# Patient Record
Sex: Female | Born: 1985 | Race: White | Hispanic: No | Marital: Single | Smoking: Current every day smoker
Health system: Southern US, Community
[De-identification: ages and names within clinical notes are randomized; demographics above are authoritative.]

## PROBLEM LIST (undated history)

## (undated) ENCOUNTER — Inpatient Hospital Stay (HOSPITAL_COMMUNITY): Payer: Self-pay

## (undated) DIAGNOSIS — I499 Cardiac arrhythmia, unspecified: Secondary | ICD-10-CM

## (undated) DIAGNOSIS — R011 Cardiac murmur, unspecified: Secondary | ICD-10-CM

## (undated) DIAGNOSIS — I493 Ventricular premature depolarization: Secondary | ICD-10-CM

## (undated) DIAGNOSIS — A749 Chlamydial infection, unspecified: Secondary | ICD-10-CM

## (undated) DIAGNOSIS — D696 Thrombocytopenia, unspecified: Secondary | ICD-10-CM

## (undated) HISTORY — DX: Thrombocytopenia, unspecified: D69.6

## (undated) HISTORY — PX: OTHER SURGICAL HISTORY: SHX169

## (undated) HISTORY — PX: DILATION AND CURETTAGE OF UTERUS: SHX78

## (undated) HISTORY — DX: Ventricular premature depolarization: I49.3

---
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
## 2007-02-04 ENCOUNTER — Emergency Department (HOSPITAL_COMMUNITY): Admission: EM | Admit: 2007-02-04 | Discharge: 2007-02-05 | Payer: Self-pay | Admitting: Emergency Medicine

## 2007-02-05 ENCOUNTER — Emergency Department (HOSPITAL_COMMUNITY): Admission: EM | Admit: 2007-02-05 | Discharge: 2007-02-05 | Payer: Self-pay | Admitting: Emergency Medicine

## 2007-03-23 ENCOUNTER — Emergency Department (HOSPITAL_COMMUNITY): Admission: EM | Admit: 2007-03-23 | Discharge: 2007-03-23 | Payer: Self-pay | Admitting: Emergency Medicine

## 2007-06-23 ENCOUNTER — Emergency Department (HOSPITAL_COMMUNITY): Admission: EM | Admit: 2007-06-23 | Discharge: 2007-06-23 | Payer: Self-pay | Admitting: Emergency Medicine

## 2007-07-23 ENCOUNTER — Emergency Department (HOSPITAL_COMMUNITY): Admission: EM | Admit: 2007-07-23 | Discharge: 2007-07-23 | Payer: Self-pay | Admitting: Emergency Medicine

## 2007-07-28 ENCOUNTER — Emergency Department (HOSPITAL_COMMUNITY): Admission: EM | Admit: 2007-07-28 | Discharge: 2007-07-28 | Payer: Self-pay | Admitting: Emergency Medicine

## 2007-09-14 ENCOUNTER — Emergency Department (HOSPITAL_COMMUNITY): Admission: EM | Admit: 2007-09-14 | Discharge: 2007-09-15 | Payer: Self-pay | Admitting: Emergency Medicine

## 2007-09-18 ENCOUNTER — Emergency Department (HOSPITAL_COMMUNITY): Admission: EM | Admit: 2007-09-18 | Discharge: 2007-09-18 | Payer: Self-pay | Admitting: Emergency Medicine

## 2007-09-30 ENCOUNTER — Emergency Department (HOSPITAL_COMMUNITY): Admission: EM | Admit: 2007-09-30 | Discharge: 2007-09-30 | Payer: Self-pay | Admitting: Emergency Medicine

## 2008-01-16 ENCOUNTER — Emergency Department (HOSPITAL_COMMUNITY): Admission: EM | Admit: 2008-01-16 | Discharge: 2008-01-16 | Payer: Self-pay | Admitting: Emergency Medicine

## 2008-01-19 ENCOUNTER — Emergency Department (HOSPITAL_COMMUNITY): Admission: EM | Admit: 2008-01-19 | Discharge: 2008-01-19 | Payer: Self-pay | Admitting: Emergency Medicine

## 2008-03-25 ENCOUNTER — Observation Stay (HOSPITAL_COMMUNITY): Admission: EM | Admit: 2008-03-25 | Discharge: 2008-03-27 | Payer: Self-pay | Admitting: Emergency Medicine

## 2008-03-25 ENCOUNTER — Encounter: Payer: Self-pay | Admitting: Emergency Medicine

## 2008-06-27 ENCOUNTER — Emergency Department (HOSPITAL_COMMUNITY): Admission: EM | Admit: 2008-06-27 | Discharge: 2008-06-27 | Payer: Self-pay | Admitting: Emergency Medicine

## 2008-10-06 ENCOUNTER — Emergency Department (HOSPITAL_COMMUNITY): Admission: EM | Admit: 2008-10-06 | Discharge: 2008-10-07 | Payer: Self-pay | Admitting: Emergency Medicine
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
## 2009-08-15 ENCOUNTER — Emergency Department (HOSPITAL_COMMUNITY): Admission: EM | Admit: 2009-08-15 | Discharge: 2009-08-16 | Payer: Self-pay | Admitting: Emergency Medicine

## 2009-08-18 ENCOUNTER — Emergency Department (HOSPITAL_COMMUNITY): Admission: EM | Admit: 2009-08-18 | Discharge: 2009-08-18 | Payer: Self-pay | Admitting: Emergency Medicine

## 2010-08-01 LAB — GC/CHLAMYDIA PROBE AMP, GENITAL
Chlamydia, DNA Probe: NEGATIVE
GC Probe Amp, Genital: NEGATIVE

## 2010-08-01 LAB — BASIC METABOLIC PANEL
BUN: 10 mg/dL (ref 6–23)
CO2: 23 mEq/L (ref 19–32)
Calcium: 9.2 mg/dL (ref 8.4–10.5)
Creatinine, Ser: 0.82 mg/dL (ref 0.4–1.2)
GFR calc Af Amer: >60 mL/min (ref >60)
GFR calc non Af Amer: >60 mL/min (ref >60)
Potassium: 4.3 mEq/L (ref 3.5–5.1)

## 2010-08-01 LAB — CBC
HCT: 39.4 % (ref 36.0–46.0)
Hemoglobin: 14 g/dL (ref 12.0–15.0)
MCHC: 35.4 g/dL (ref 30.0–36.0)
MCV: 91.4 fL (ref 78.0–100.0)
Platelets: 121 10*3/uL — ABNORMAL LOW (ref 150–400)
RBC: 4.32 MIL/uL (ref 3.87–5.11)
RDW: 12.9 % (ref 11.5–15.5)
WBC: 7.6 10*3/uL (ref 4.0–10.5)

## 2010-08-01 LAB — DIFFERENTIAL
Basophils Absolute: 0 10*3/uL (ref 0.0–0.1)
Eosinophils Relative: 3 % (ref 0–5)
Lymphocytes Relative: 20 % (ref 12–46)
Lymphs Abs: 1.5 10*3/uL (ref 0.7–4.0)
Monocytes Absolute: 0.4 10*3/uL (ref 0.1–1.0)

## 2010-08-01 LAB — WET PREP, GENITAL: Clue Cells Wet Prep HPF POC: NONE SEEN

## 2010-08-01 LAB — HCG, QUANTITATIVE, PREGNANCY
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
## 2010-09-11 ENCOUNTER — Emergency Department (HOSPITAL_COMMUNITY)
Admission: EM | Admit: 2010-09-11 | Discharge: 2010-09-11 | Disposition: A | Discharge status: Home / Self Care | Payer: Self-pay | Attending: Emergency Medicine | Admitting: Emergency Medicine

## 2010-09-11 ENCOUNTER — Emergency Department (HOSPITAL_COMMUNITY): Payer: Self-pay

## 2010-09-11 DIAGNOSIS — R071 Chest pain on breathing: Secondary | ICD-10-CM | POA: Insufficient documentation

## 2010-09-11 DIAGNOSIS — I498 Other specified cardiac arrhythmias: Secondary | ICD-10-CM | POA: Insufficient documentation

## 2010-09-11 DIAGNOSIS — O99891 Other specified diseases and conditions complicating pregnancy: Secondary | ICD-10-CM | POA: Insufficient documentation

## 2010-09-11 DIAGNOSIS — S20219A Contusion of unspecified front wall of thorax, initial encounter: Secondary | ICD-10-CM | POA: Insufficient documentation

## 2010-12-13 ENCOUNTER — Inpatient Hospital Stay (HOSPITAL_COMMUNITY)
Admission: AD | Admit: 2010-12-13 | Discharge: 2010-12-14 | Disposition: A | Discharge status: Home / Self Care | Payer: Self-pay | Source: Ambulatory Visit | Attending: Obstetrics & Gynecology | Admitting: Obstetrics & Gynecology

## 2010-12-13 ENCOUNTER — Encounter (HOSPITAL_COMMUNITY): Payer: Self-pay

## 2010-12-13 DIAGNOSIS — Z1389 Encounter for screening for other disorder: Secondary | ICD-10-CM

## 2010-12-13 DIAGNOSIS — O99891 Other specified diseases and conditions complicating pregnancy: Secondary | ICD-10-CM | POA: Insufficient documentation

## 2010-12-13 DIAGNOSIS — Z3400 Encounter for supervision of normal first pregnancy, unspecified trimester: Secondary | ICD-10-CM

## 2010-12-13 DIAGNOSIS — R109 Unspecified abdominal pain: Secondary | ICD-10-CM | POA: Insufficient documentation

## 2010-12-13 DIAGNOSIS — Z3490 Encounter for supervision of normal pregnancy, unspecified, unspecified trimester: Secondary | ICD-10-CM

## 2010-12-13 HISTORY — DX: Cardiac murmur, unspecified: R01.1

## 2010-12-13 HISTORY — DX: Chlamydial infection, unspecified: A74.9

## 2010-12-13 LAB — URINALYSIS, ROUTINE W REFLEX MICROSCOPIC
Bilirubin Urine: NEGATIVE
Glucose, UA: NEGATIVE mg/dL
Ketones, ur: NEGATIVE mg/dL
Leukocytes, UA: NEGATIVE
Nitrite: NEGATIVE
Specific Gravity, Urine: 1.02 (ref 1.005–1.030)
pH: 6.5 (ref 5.0–8.0)

## 2010-12-13 LAB — CBC
Hemoglobin: 15.1 g/dL — ABNORMAL HIGH (ref 12.0–15.0)
MCH: 32 pg (ref 26.0–34.0)
Platelets: 149 10*3/uL — ABNORMAL LOW (ref 150–400)
RBC: 4.72 MIL/uL (ref 3.87–5.11)
WBC: 13.2 10*3/uL — ABNORMAL HIGH (ref 4.0–10.5)

## 2010-12-13 LAB — URINE MICROSCOPIC-ADD ON

## 2010-12-13 NOTE — Progress Notes (Signed)
Patient is here with c/o vaginal bleeding that started at 2030pm and lower abdominal cramping. She does not have a pad on,. lmp July 18th

## 2010-12-13 NOTE — ED Provider Notes (Signed)
History     Chief Complaint  Patient presents with  . Abdominal Cramping  . Vaginal Bleeding   HPI LMP 7/18, + UPT at home on 8/11 and 8/12 Cramping tonight which is ongoing, suprapubic, noticed spotting afterwards, has since stopped No recent intercourse  OB History    Grav Para Term Preterm Abortions TAB SAB Ect Mult Living   4    3 3           Past Medical History  Diagnosis Date  . Heart murmur     born with condition  . Chlamydia     Past Surgical History  Procedure Date  . Boil     facial boil i&d  . Dilation and curettage of uterus     abortion x3    Family History  Problem Relation Age of Onset  . Emphysema Mother   . Heart attack Father   . Cancer Maternal Grandmother   . Heart attack Paternal Grandmother     History  Substance Use Topics  . Smoking status: Current Everyday Smoker -- 0.2 packs/day    Types: Cigarettes  . Smokeless tobacco: Not on file  . Alcohol Use: No    Allergies:  Allergies  Allergen Reactions  . Penicillins     Never had it - "I just know from birth that I'm not supposed to take it"    No prescriptions prior to admission    Review of Systems  Constitutional: Negative.   Respiratory: Negative.   Cardiovascular: Negative.   Gastrointestinal: Negative for nausea, vomiting, abdominal pain, diarrhea and constipation.  Genitourinary: Negative for dysuria, urgency, frequency, hematuria and flank pain.       Positive for spotting and cramping   Musculoskeletal: Negative.   Neurological: Negative.   Psychiatric/Behavioral: Negative.     Physical Exam   Blood pressure 128/66, pulse 72, temperature 99.4 F (37.4 C), temperature source Oral, resp. rate 20, height 5\' 6"  (1.676 m), weight 81.818 kg (180 lb 6 oz), last menstrual period 11/06/2010.  Physical Exam  Nursing note and vitals reviewed. Constitutional: She is oriented to person, place, and time. She appears well-developed and well-nourished. No distress.  HENT:    Head: Normocephalic and atraumatic.  Cardiovascular: Normal rate.   Respiratory: Effort normal.  GI: Soft. Bowel sounds are normal. She exhibits no mass. There is no tenderness. There is no rebound and no guarding.  Genitourinary: There is no rash or lesion on the right labia. There is no rash or lesion on the left labia. Uterus is not enlarged and not tender. Cervix exhibits no motion tenderness, no discharge and no friability. Right adnexum displays no mass, no tenderness and no fullness. Left adnexum displays no mass, no tenderness and no fullness. No tenderness or bleeding around the vagina. Vaginal discharge (light pinkish creamy discharge) found.  Musculoskeletal: Normal range of motion.  Neurological: She is alert and oriented to person, place, and time.  Skin: Skin is warm and dry.  Psychiatric: She has a normal mood and affect.    MAU Course  Procedures  Results for orders placed during the hospital encounter of 12/13/10 (from the past 24 hour(s))  URINALYSIS, ROUTINE W REFLEX MICROSCOPIC     Status: Abnormal   Collection Time   12/13/10 10:30 PM      Component Value Range   Color, Urine YELLOW  YELLOW    Appearance CLEAR  CLEAR    Specific Gravity, Urine 1.020  1.005 - 1.030    pH 6.5  5.0 - 8.0    Glucose, UA NEGATIVE  NEGATIVE (mg/dL)   Hgb urine dipstick TRACE (*) NEGATIVE    Bilirubin Urine NEGATIVE  NEGATIVE    Ketones, ur NEGATIVE  NEGATIVE (mg/dL)   Protein, ur NEGATIVE  NEGATIVE (mg/dL)   Urobilinogen, UA 0.2  0.0 - 1.0 (mg/dL)   Nitrite NEGATIVE  NEGATIVE    Leukocytes, UA NEGATIVE  NEGATIVE   URINE MICROSCOPIC-ADD ON     Status: Normal   Collection Time   12/13/10 10:30 PM      Component Value Range   Squamous Epithelial / LPF RARE  RARE    WBC, UA    <3 (WBC/hpf)   Value: NO FORMED ELEMENTS SEEN ON URINE MICROSCOPIC EXAMINATION   RBC / HPF 3-6  <3 (RBC/hpf)   Bacteria, UA RARE  RARE   POCT PREGNANCY, URINE     Status: Normal   Collection Time   12/13/10  10:38 PM      Component Value Range   Preg Test, Ur POSITIVE    CBC     Status: Abnormal   Collection Time   12/13/10 11:15 PM      Component Value Range   WBC 13.2 (*) 4.0 - 10.5 (K/uL)   RBC 4.72  3.87 - 5.11 (MIL/uL)   Hemoglobin 15.1 (*) 12.0 - 15.0 (g/dL)   HCT 78.2  95.6 - 21.3 (%)   MCV 91.5  78.0 - 100.0 (fL)   MCH 32.0  26.0 - 34.0 (pg)   MCHC 35.0  30.0 - 36.0 (g/dL)   RDW 08.6  57.8 - 46.9 (%)   Platelets 149 (*) 150 - 400 (K/uL)  ABO/RH     Status: Normal   Collection Time   12/13/10 11:15 PM      Component Value Range   ABO/RH(D) O NEG    HCG, QUANTITATIVE, PREGNANCY     Status: Abnormal   Collection Time   12/13/10 11:16 PM      Component Value Range   hCG, Beta Chain, Quant, S 10477 (*) <5 (mIU/mL)  WET PREP, GENITAL     Status: Abnormal   Collection Time   12/14/10 12:06 AM      Component Value Range   Yeast, Wet Prep NONE SEEN  NONE SEEN    Trich, Wet Prep NONE SEEN  NONE SEEN    Clue Cells, Wet Prep NONE SEEN  NONE SEEN    WBC, Wet Prep HPF POC FEW (*) NONE SEEN    US Ob Comp Less 14 Wks  12/14/2010  *RADIOLOGY REPORT*  Clinical Data: early pregnant, bleeding and pain,spotting; ;  OBSTETRIC <14 WK [place] AND TRANSVAGINAL OB US  Technique: Both transabdominal and transvaginal ultrasound examinations were performed for complete evaluation of the gestation as well as the maternal uterus, adnexal regions, and pelvic cul-de-sac.  Comparison: None.  Findings: Single intrauterine pregnancy.  Based on mean sac diameter, estimated gestational age is 5 weeks 5 days.  Yolk sac is present.  No fetal pole currently.  No subchorionic hemorrhage.  Left corpus luteal cyst.  Right ovary unremarkable.  No free fluid.  IMPRESSION: 5-week-5-day intrauterine pregnancy.  No embryo currently visualized.  Original Report Authenticated By: Cyndie Chime, M.D.   US Ob Transvaginal  12/14/2010  *RADIOLOGY REPORT*  Clinical Data: early pregnant, bleeding and pain,spotting; ;  OBSTETRIC <14 WK  [place] AND TRANSVAGINAL OB US  Technique: Both transabdominal and transvaginal ultrasound examinations were performed for complete evaluation of the gestation  as well as the maternal uterus, adnexal regions, and pelvic cul-de-sac.  Comparison: None.  Findings: Single intrauterine pregnancy.  Based on mean sac diameter, estimated gestational age is 5 weeks 5 days.  Yolk sac is present.  No fetal pole currently.  No subchorionic hemorrhage.  Left corpus luteal cyst.  Right ovary unremarkable.  No free fluid.  IMPRESSION: 5-week-5-day intrauterine pregnancy.  No embryo currently visualized.  Original Report Authenticated By: Cyndie Chime, M.D.    Assessment and Plan  25 y.o. G4P0030 with [redacted]w[redacted]d IUP Moving to [place], plans prenatal care there Rev'd precautions, return to ED with increased bleeding or pain  [name] 12/13/2010, 11:52 PM

## 2010-12-13 NOTE — Progress Notes (Signed)
Pt states, " I had a positive home pregnancy test on August 12th. Tonight I started cramping at 8:30pm, and I went to the bathroom and saw blood on my panties . I wiped and there was blood on the paper. I went again and wiped and didn't see anymore blood."

## 2010-12-14 ENCOUNTER — Inpatient Hospital Stay (HOSPITAL_COMMUNITY): Payer: Self-pay

## 2010-12-14 DIAGNOSIS — Z3400 Encounter for supervision of normal first pregnancy, unspecified trimester: Secondary | ICD-10-CM

## 2010-12-14 LAB — ABO/RH: ABO/RH(D): O NEG

## 2010-12-14 LAB — WET PREP, GENITAL: Yeast Wet Prep HPF POC: NONE SEEN

## 2010-12-14 LAB — GC/CHLAMYDIA PROBE AMP, GENITAL
Chlamydia, DNA Probe: NEGATIVE
GC Probe Amp, Genital: NEGATIVE

## 2011-01-14 LAB — CBC
HCT: 40.8
MCHC: 34
MCV: 90.3
Platelets: 137 — ABNORMAL LOW
RDW: 13
WBC: 7.8

## 2011-01-14 LAB — RAPID URINE DRUG SCREEN, HOSP PERFORMED
Barbiturates: NOT DETECTED
Benzodiazepines: NOT DETECTED
Cocaine: NOT DETECTED
Opiates: NOT DETECTED

## 2011-01-14 LAB — URINALYSIS, ROUTINE W REFLEX MICROSCOPIC
Bilirubin Urine: NEGATIVE
Ketones, ur: NEGATIVE
Nitrite: NEGATIVE
Protein, ur: >300 — AB
pH: 7

## 2011-01-14 LAB — BASIC METABOLIC PANEL
BUN: 9
CO2: 26
Chloride: 106
Creatinine, Ser: 0.76
Glucose, Bld: 91
Potassium: 4

## 2011-01-14 LAB — DIFFERENTIAL
Basophils Relative: 1
Eosinophils Absolute: 0.3
Eosinophils Relative: 4
Lymphs Abs: 1.5
Neutrophils Relative %: 72

## 2011-01-14 LAB — PREGNANCY, URINE: Preg Test, Ur: NEGATIVE

## 2011-01-14 LAB — URINE MICROSCOPIC-ADD ON

## 2011-01-15 LAB — CBC
MCHC: 34
Platelets: 168
Platelets: 142 — ABNORMAL LOW
RDW: 13.1
RDW: 13.1
WBC: 12.9 — ABNORMAL HIGH

## 2011-01-15 LAB — DIFFERENTIAL
Basophils Absolute: 0
Basophils Absolute: 0.1
Basophils Relative: 0
Eosinophils Relative: 1
Lymphocytes Relative: 26
Lymphocytes Relative: 13
Lymphs Abs: 1.7
Neutro Abs: 5.9
Neutro Abs: 10.4 — ABNORMAL HIGH
Neutrophils Relative %: 81 — ABNORMAL HIGH

## 2011-01-15 LAB — POCT I-STAT, CHEM 8
BUN: 13
Chloride: 104
HCT: 41
Sodium: 139
TCO2: 28

## 2011-01-15 LAB — URINALYSIS, ROUTINE W REFLEX MICROSCOPIC
Bilirubin Urine: NEGATIVE
Bilirubin Urine: NEGATIVE
Glucose, UA: NEGATIVE
Ketones, ur: NEGATIVE
Ketones, ur: NEGATIVE
Nitrite: NEGATIVE
Protein, ur: NEGATIVE
Specific Gravity, Urine: 1.018
Urobilinogen, UA: 0.2
Urobilinogen, UA: 1
pH: 5.5
pH: 7

## 2011-01-15 LAB — GC/CHLAMYDIA PROBE AMP, GENITAL
Chlamydia, DNA Probe: POSITIVE — AB
GC Probe Amp, Genital: NEGATIVE

## 2011-01-15 LAB — POCT PREGNANCY, URINE
Operator id: 196461
Preg Test, Ur: NEGATIVE

## 2011-01-15 LAB — WET PREP, GENITAL
Clue Cells Wet Prep HPF POC: NONE SEEN
Trich, Wet Prep: NONE SEEN
Trich, Wet Prep: NONE SEEN
Yeast Wet Prep HPF POC: NONE SEEN

## 2011-01-15 LAB — URINE MICROSCOPIC-ADD ON

## 2011-01-15 LAB — COMPREHENSIVE METABOLIC PANEL
ALT: 11
AST: 17
Albumin: 4
CO2: 28
Chloride: 103
Creatinine, Ser: 0.79
GFR calc Af Amer: >60
Sodium: 139
Total Bilirubin: 0.7

## 2011-01-15 LAB — RPR: RPR Ser Ql: NONREACTIVE

## 2011-01-15 LAB — URINE CULTURE: Culture: NO GROWTH

## 2011-01-24 LAB — CBC
HCT: 38.1 % (ref 36.0–46.0)
HCT: 41.2 % (ref 36.0–46.0)
Hemoglobin: 12.8 g/dL (ref 12.0–15.0)
Hemoglobin: 13.9 g/dL (ref 12.0–15.0)
MCHC: 33.7 g/dL (ref 30.0–36.0)
MCV: 91.9 fL (ref 78.0–100.0)
Platelets: 128 10*3/uL — ABNORMAL LOW (ref 150–400)
Platelets: 140 10*3/uL — ABNORMAL LOW (ref 150–400)
RBC: 4.18 MIL/uL (ref 3.87–5.11)
RDW: 12.9 % (ref 11.5–15.5)
RDW: 13 % (ref 11.5–15.5)
WBC: 10.4 10*3/uL (ref 4.0–10.5)
WBC: 9.1 10*3/uL (ref 4.0–10.5)

## 2011-01-24 LAB — POCT I-STAT, CHEM 8
Calcium, Ion: 0.97 mmol/L — ABNORMAL LOW (ref 1.12–1.32)
Chloride: 110 mEq/L (ref 96–112)
HCT: 43 % (ref 36.0–46.0)
Hemoglobin: 14.6 g/dL (ref 12.0–15.0)
TCO2: 18 mmol/L (ref 0–100)

## 2011-01-24 LAB — BASIC METABOLIC PANEL
BUN: 11 mg/dL (ref 6–23)
Calcium: 9.1 mg/dL (ref 8.4–10.5)
GFR calc non Af Amer: >60 mL/min (ref >60)
Glucose, Bld: 81 mg/dL (ref 70–99)

## 2011-01-24 LAB — URINALYSIS, ROUTINE W REFLEX MICROSCOPIC
Bilirubin Urine: NEGATIVE
Glucose, UA: NEGATIVE mg/dL
Hgb urine dipstick: NEGATIVE
Ketones, ur: NEGATIVE mg/dL
Nitrite: NEGATIVE
Nitrite: NEGATIVE
Specific Gravity, Urine: 1.028 (ref 1.005–1.030)
Urobilinogen, UA: 1 mg/dL (ref 0.0–1.0)
pH: 6 (ref 5.0–8.0)
pH: 6 (ref 5.0–8.0)

## 2011-01-24 LAB — DIFFERENTIAL
Basophils Absolute: 0 10*3/uL (ref 0.0–0.1)
Basophils Absolute: 0 10*3/uL (ref 0.0–0.1)
Eosinophils Relative: 3 % (ref 0–5)
Lymphocytes Relative: 15 % (ref 12–46)
Lymphocytes Relative: 22 % (ref 12–46)
Monocytes Absolute: 0.4 10*3/uL (ref 0.1–1.0)
Neutro Abs: 7 10*3/uL (ref 1.7–7.7)
Neutrophils Relative %: 67 % (ref 43–77)

## 2011-01-24 LAB — URINE MICROSCOPIC-ADD ON

## 2011-01-27 LAB — RAPID STREP SCREEN (MED CTR MEBANE ONLY): Streptococcus, Group A Screen (Direct): NEGATIVE

## 2011-01-29 LAB — CBC
Hemoglobin: 11.1 — ABNORMAL LOW
MCHC: 34.4
Platelets: 123 — ABNORMAL LOW
RBC: 3.57 — ABNORMAL LOW
RDW: 12.8
RDW: 13

## 2011-01-29 LAB — COMPREHENSIVE METABOLIC PANEL
AST: 15
Albumin: 2.5 — ABNORMAL LOW
Chloride: 107
Creatinine, Ser: 0.66
GFR calc Af Amer: >60
Total Bilirubin: 0.6

## 2011-01-29 LAB — DIFFERENTIAL
Basophils Absolute: 0
Basophils Relative: 0
Eosinophils Relative: 0
Lymphocytes Relative: 13
Lymphocytes Relative: 19
Lymphs Abs: 1.3
Monocytes Absolute: 0.5
Neutro Abs: 8.4 — ABNORMAL HIGH
Neutrophils Relative %: 80 — ABNORMAL HIGH

## 2011-01-29 LAB — RAPID STREP SCREEN (MED CTR MEBANE ONLY): Streptococcus, Group A Screen (Direct): NEGATIVE

## 2011-02-13 LAB — ABO/RH: RH Type: NEGATIVE

## 2011-02-13 LAB — RUBELLA ANTIBODY, IGM: Rubella: IMMUNE

## 2011-04-22 NOTE — L&D Delivery Note (Signed)
Delivery Note At 4:27 PM a viable and healthy female was delivered via Vaginal, Spontaneous Delivery (Presentation: Middle Occiput Anterior).  Pt had no chest pain or complications while pushing.   APGAR: 7, 9; weight .   Placenta status: Intact, Spontaneous.  Cord: 3 vessels with the following complications: None.  Cord pH: n/a  Anesthesia: Epidural  Episiotomy: None Lacerations: None Suture Repair: n/a Est. Blood Loss (mL): 500  Mom to postpartum.  Baby to nursery-stable.  [facility] 08/23/2011, 5:20 PM

## 2011-05-22 LAB — GLUCOSE TOLERANCE, 1 HOUR: Glucose, 1 hour: 87

## 2011-05-22 LAB — CBC
HCT: 36 % (ref 36–46)
Platelets: 127 10*3/uL — AB (ref 150–399)

## 2011-06-29 LAB — OB RESULTS CONSOLE HEPATITIS B SURFACE ANTIGEN: Hepatitis B Surface Ag: NEGATIVE

## 2011-07-01 ENCOUNTER — Encounter: Payer: Self-pay | Admitting: Obstetrics & Gynecology

## 2011-07-08 DIAGNOSIS — O360990 Maternal care for other rhesus isoimmunization, unspecified trimester, not applicable or unspecified: Secondary | ICD-10-CM | POA: Insufficient documentation

## 2011-07-08 DIAGNOSIS — D696 Thrombocytopenia, unspecified: Secondary | ICD-10-CM

## 2011-07-10 ENCOUNTER — Encounter: Payer: Self-pay | Admitting: Family Medicine

## 2011-07-10 ENCOUNTER — Ambulatory Visit (INDEPENDENT_AMBULATORY_CARE_PROVIDER_SITE_OTHER): Payer: Self-pay | Admitting: Family Medicine

## 2011-07-10 VITALS — BP 105/74 | Temp 96.7°F | Wt 225.9 lb

## 2011-07-10 DIAGNOSIS — O99333 Smoking (tobacco) complicating pregnancy, third trimester: Secondary | ICD-10-CM

## 2011-07-10 DIAGNOSIS — Z3400 Encounter for supervision of normal first pregnancy, unspecified trimester: Secondary | ICD-10-CM

## 2011-07-10 DIAGNOSIS — O2340 Unspecified infection of urinary tract in pregnancy, unspecified trimester: Secondary | ICD-10-CM

## 2011-07-10 DIAGNOSIS — D696 Thrombocytopenia, unspecified: Secondary | ICD-10-CM

## 2011-07-10 DIAGNOSIS — O99330 Smoking (tobacco) complicating pregnancy, unspecified trimester: Secondary | ICD-10-CM

## 2011-07-10 DIAGNOSIS — O2390 Unspecified genitourinary tract infection in pregnancy, unspecified trimester: Secondary | ICD-10-CM

## 2011-07-10 DIAGNOSIS — N390 Urinary tract infection, site not specified: Secondary | ICD-10-CM

## 2011-07-10 LAB — POCT URINALYSIS DIP (DEVICE)
Ketones, ur: 15 mg/dL — AB
Leukocytes, UA: NEGATIVE

## 2011-07-10 NOTE — Progress Notes (Signed)
Edema-feet. Pelvic pressure. Pulse 93. Pt to see nutrition and Child psychotherapist.

## 2011-07-10 NOTE — Patient Instructions (Signed)
Pregnancy and Smoking Smoking during pregnancy is very unhealthy for the mother and the developing fetus. The addictive drug in cigarettes (nicotine), carbon monoxide, and many other poisons are inhaled from a cigarette and are carried through your bloodstream to your fetus. Cigarette smoke contains more than 2,500 chemicals. It is not known which of these chemicals are harmful to the developing fetus. However, both nicotine and carbon monoxide play a role in causing health problems in pregnancy. Effects on the fetus of smoking during pregnancy:  Decrease in blood flow and oxygen to the uterus, placenta, and your fetus.   Increased heart rate of the fetus.   Slowing of your fetus's growth in the uterus (intrauterine growth retardation).   Placental problems. Placenta may partially cover or completely cover the opening to the cervix (placenta previa), or the placenta may partially or completely separate from the uterus (placental abruption).   Increase risk of pregnancy outside of the uterus (tubal pregnancy).   Premature rupture membranes, causing the sac that holds the fetus to break too early, resulting in premature birth and increased health risks to the newborn.   Increased risk of birth defects, including heart defects.   Increased risk of miscarriage.  Newborns born to women who smoke during pregnancy:  Are more likely to be born too early (prematurely).   Are more likely to be at a low birth weight.   Are at risk for serious health problems, chronic or lifelong disabilities (cerebral palsy, mental retardation, learning problems), and possibly even death   Are at risk of Sudden Infant Death Syndrome (SIDS).   Have higher rates of miscarriage and stillbirth.   Have more lung and breathing (respiratory) problems.  Long-term effects on a child's behavior: Some of the following trends are seen with children of smoking mothers:  Increased risk for drug abuse, behavior, and  conduct disorders.   Increased risk for smoking in adolescent girls.   Increased risk for negative behavior in 2-year-olds.   Increase risk for asthma, colic, and childhood obesity, which can lead to diabetes.   Increased risk for finger and toe disorders.  Resources to stop smoking during pregnancy:  Counseling.   Psychological treatment.   Acupuncture.   Family intervention.   Hypnosis.   Medicines that are safe to take during pregnancy. Nicotine supplements have not been studied enough. They should only be considered when all other methods fail.   Telephone QUIT lines.  Smoking and Breastfeeding:  Nicotine gets passed to the infant through a mother's breastmilk. This can cause nausea, colic, cramping, and diarrhea in the infant.   Smoking may reduce milk supply and interfere with the let-down response.   Even formula-fed infants of mothers who smoke have nicotine and cotinine (nicotine by-product) in their urine.  Other resources to help stop smoking:  American Cancer Society: www.cancer.org   American Heart Association: www.americanheart.org   National Cancer Institute: www.cancer.gov   Smoke Free Families: www.smokefreefamilies.[address] [place] Line): 732-700-7872 START  Document Released: 08/19/2004 Document Revised: 03/27/2011 Document Reviewed: 01/17/2009 [facility] Patient Information 2012 [place], [place]. Contraception Choices Contraception (birth control) is the use of any methods or devices to prevent pregnancy. Below are some methods to help avoid pregnancy. HORMONAL METHODS   Contraceptive implant. This is a thin, plastic tube containing progesterone hormone. It does not contain estrogen hormone. Your caregiver inserts the tube in the inner part of the upper arm. The tube can remain in place for up to 3 years. After 3 years,  the implant must be removed. The implant prevents the ovaries from releasing an egg (ovulation), thickens the cervical mucus which  prevents sperm from entering the uterus, and thins the lining of the inside of the uterus.   Progesterone-only injections. These injections are given every 3 months by your caregiver to prevent pregnancy. This synthetic progesterone hormone stops the ovaries from releasing eggs. It also thickens cervical mucus and changes the uterine lining. This makes it harder for sperm to survive in the uterus.   Birth control pills. These pills contain estrogen and progesterone hormone. They work by stopping the egg from forming in the ovary (ovulation). Birth control pills are prescribed by a caregiver.Birth control pills can also be used to treat heavy periods.   Minipill. This type of birth control pill contains only the progesterone hormone. They are taken every day of each month and must be prescribed by your caregiver.   Birth control patch. The patch contains hormones similar to those in birth control pills. It must be changed once a week and is prescribed by a caregiver.   Vaginal ring. The ring contains hormones similar to those in birth control pills. It is left in the vagina for 3 weeks, removed for 1 week, and then a new one is put back in place. The patient must be comfortable inserting and removing the ring from the vagina.A caregiver's prescription is necessary.   Emergency contraception. Emergency contraceptives prevent pregnancy after unprotected sexual intercourse. This pill can be taken right after sex or up to 5 days after unprotected sex. It is most effective the sooner you take the pills after having sexual intercourse. Emergency contraceptive pills are available without a prescription. Check with your pharmacist. Do not use emergency contraception as your only form of birth control.  BARRIER METHODS   Female condom. This is a thin sheath (latex or rubber) that is worn over the penis during sexual intercourse. It can be used with spermicide to increase effectiveness.   Female condom. This  is a soft, loose-fitting sheath that is put into the vagina before sexual intercourse.   Diaphragm. This is a soft, latex, dome-shaped barrier that must be fitted by a caregiver. It is inserted into the vagina, along with a spermicidal jelly. It is inserted before intercourse. The diaphragm should be left in the vagina for 6 to 8 hours after intercourse.   Cervical cap. This is a round, soft, latex or plastic cup that fits over the cervix and must be fitted by a caregiver. The cap can be left in place for up to 48 hours after intercourse.   Sponge. This is a soft, circular piece of polyurethane foam. The sponge has spermicide in it. It is inserted into the vagina after wetting it and before sexual intercourse.   Spermicides. These are chemicals that kill or block sperm from entering the cervix and uterus. They come in the form of creams, jellies, suppositories, foam, or tablets. They do not require a prescription. They are inserted into the vagina with an applicator before having sexual intercourse. The process must be repeated every time you have sexual intercourse.  INTRAUTERINE CONTRACEPTION  Intrauterine device (IUD). This is a T-shaped device that is put in a woman's uterus during a menstrual period to prevent pregnancy. There are 2 types:   Copper IUD. This type of IUD is wrapped in copper wire and is placed inside the uterus. Copper makes the uterus and fallopian tubes produce a fluid that kills sperm. It can  stay in place for 10 years.   Hormone IUD. This type of IUD contains the hormone progestin (synthetic progesterone). The hormone thickens the cervical mucus and prevents sperm from entering the uterus, and it also thins the uterine lining to prevent implantation of a fertilized egg. The hormone can weaken or kill the sperm that get into the uterus. It can stay in place for 5 years.  PERMANENT METHODS OF CONTRACEPTION  Female tubal ligation. This is when the woman's fallopian tubes are  surgically sealed, tied, or blocked to prevent the egg from traveling to the uterus.   Female sterilization. This is when the female has the tubes that carry sperm tied off (vasectomy).This blocks sperm from entering the vagina during sexual intercourse. After the procedure, the man can still ejaculate fluid (semen).  NATURAL PLANNING METHODS  Natural family planning. This is not having sexual intercourse or using a barrier method (condom, diaphragm, cervical cap) on days the woman could become pregnant.   Calendar method. This is keeping track of the length of each menstrual cycle and identifying when you are fertile.   Ovulation method. This is avoiding sexual intercourse during ovulation.   Symptothermal method. This is avoiding sexual intercourse during ovulation, using a thermometer and ovulation symptoms.   Post-ovulation method. This is timing sexual intercourse after you have ovulated.  Regardless of which type or method of contraception you choose, it is important that you use condoms to protect against the transmission of sexually transmitted diseases (STDs). Talk with your caregiver about which form of contraception is most appropriate for you. Document Released: 04/07/2005 Document Revised: 03/27/2011 Document Reviewed: 08/14/2010 [facility] Patient Information 2012 [place], [place]. Breastfeeding BENEFITS OF BREASTFEEDING For the baby  The first milk (colostrum) helps the baby's digestive system function better.   There are antibodies from the mother in the milk that help the baby fight off infections.   The baby has a lower incidence of asthma, allergies, and SIDS (sudden infant death syndrome).   The nutrients in breast milk are better than formulas for the baby and helps the baby's brain grow better.   Babies who breastfeed have less gas, colic, and constipation.  For the mother  Breastfeeding helps develop a very special bond between mother and baby.   It is more  convenient, always available at the correct temperature and cheaper than formula feeding.   It burns calories in the mother and helps with losing weight that was gained during pregnancy.   It makes the uterus contract back down to normal size faster and slows bleeding following delivery.   Breastfeeding mothers have a lower risk of developing breast cancer.  NURSE FREQUENTLY  A healthy, full-term baby may breastfeed as often as every hour or space his or her feedings to every 3 hours.   How often to nurse will vary from baby to baby. Watch your baby for signs of hunger, not the clock.   Nurse as often as the baby requests, or when you feel the need to reduce the fullness of your breasts.   Awaken the baby if it has been 3 to 4 hours since the last feeding.   Frequent feeding will help the mother make more milk and will prevent problems like sore nipples and engorgement of the breasts.  BABY'S POSITION AT THE BREAST  Whether lying down or sitting, be sure that the baby's tummy is facing your tummy.   Support the breast with 4 fingers underneath the breast and the thumb above.  Make sure your fingers are well away from the nipple and baby's mouth.   Stroke the baby's lips and cheek closest to the breast gently with your finger or nipple.   When the baby's mouth is open wide enough, place all of your nipple and as much of the dark area around the nipple as possible into your baby's mouth.   Pull the baby in close so the tip of the nose and the baby's cheeks touch the breast during the feeding.  FEEDINGS  The length of each feeding varies from baby to baby and from feeding to feeding.   The baby must suck about 2 to 3 minutes for your milk to get to him or her. This is called a "let down." For this reason, allow the baby to feed on each breast as long as he or she wants. Your baby will end the feeding when he or she has received the right balance of nutrients.   To break the suction,  put your finger into the corner of the baby's mouth and slide it between his or her gums before removing your breast from his or her mouth. This will help prevent sore nipples.  REDUCING BREAST ENGORGEMENT  In the first week after your baby is born, you may experience signs of breast engorgement. When breasts are engorged, they feel heavy, warm, full, and may be tender to the touch. You can reduce engorgement if you:   Nurse frequently, every 2 to 3 hours. Mothers who breastfeed early and often have fewer problems with engorgement.   Place light ice packs on your breasts between feedings. This reduces swelling. Wrap the ice packs in a lightweight towel to protect your skin.   Apply moist hot packs to your breast for 5 to 10 minutes before each feeding. This increases circulation and helps the milk flow.   Gently massage your breast before and during the feeding.   Make sure that the baby empties at least one breast at every feeding before switching sides.   Use a breast pump to empty the breasts if your baby is sleepy or not nursing well. You may also want to pump if you are returning to work or or you feel you are getting engorged.   Avoid bottle feeds, pacifiers or supplemental feedings of water or juice in place of breastfeeding.   Be sure the baby is latched on and positioned properly while breastfeeding.   Prevent fatigue, stress, and anemia.   Wear a supportive bra, avoiding underwire styles.   Eat a balanced diet with enough fluids.  If you follow these suggestions, your engorgement should improve in 24 to 48 hours. If you are still experiencing difficulty, call your lactation consultant or caregiver. IS MY BABY GETTING ENOUGH MILK? Sometimes, mothers worry about whether their babies are getting enough milk. You can be assured that your baby is getting enough milk if:  The baby is actively sucking and you hear swallowing.   The baby nurses at least 8 to 12 times in a 24 hour  time period. Nurse your baby until he or she unlatches or falls asleep at the first breast (at least 10 to 20 minutes), then offer the second side.   The baby is wetting 5 to 6 disposable diapers (6 to 8 cloth diapers) in a 24 hour period by 79 to 54 days of age.   The baby is having at least 2 to 3 stools every 24 hours for the first few months.  Breast milk is all the food your baby needs. It is not necessary for your baby to have water or formula. In fact, to help your breasts make more milk, it is best not to give your baby supplemental feedings during the early weeks.   The stool should be soft and yellow.   The baby should gain 4 to 7 ounces per week after he is 66 days old.  TAKE CARE OF YOURSELF Take care of your breasts by:  Bathing or showering daily.   Avoiding the use of soaps on your nipples.   Start feedings on your left breast at one feeding and on your right breast at the next feeding.   You will notice an increase in your milk supply 2 to 5 days after delivery. You may feel some discomfort from engorgement, which makes your breasts very firm and often tender. Engorgement "peaks" out within 24 to 48 hours. In the meantime, apply warm moist towels to your breasts for 5 to 10 minutes before feeding. Gentle massage and expression of some milk before feeding will soften your breasts, making it easier for your baby to latch on. Wear a well fitting nursing bra and air dry your nipples for 10 to 15 minutes after each feeding.   Only use cotton bra pads.   Only use pure lanolin on your nipples after nursing. You do not need to wash it off before nursing.  Take care of yourself by:   Eating well-balanced meals and nutritious snacks.   Drinking milk, fruit juice, and water to satisfy your thirst (about 8 glasses a day).   Getting plenty of rest.   Increasing calcium in your diet (1200 mg a day).   Avoiding foods that you notice affect the baby in a bad way.  SEEK MEDICAL CARE IF:     You have any questions or difficulty with breastfeeding.   You need help.   You have a hard, red, sore area on your breast, accompanied by a fever of 100.5 F (38.1 C) or more.   Your baby is too sleepy to eat well or is having trouble sleeping.   Your baby is wetting less than 6 diapers per day, by 90 days of age.   Your baby's skin or white part of his or her eyes is more yellow than it was in the hospital.   You feel depressed.  Document Released: 04/07/2005 Document Revised: 03/27/2011 Document Reviewed: 11/20/2008 [facility] Patient Information 2012 [place], [place].

## 2011-07-10 NOTE — Progress Notes (Incomplete)
Nutrition Note: (1st consult/1st appt) Pt is an overweight [redacted]w[redacted]d gestation woman who just moved to area from [place]. Pt has excessive wt gain of 45.9# with first(current) pregnancy.  Pt has hx of heart murmur and anemia. Currently taking PNV daily and antibiotic.  Pt reports excessive intake of 3 large meals and 4 snacks daily. 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  Disc wt gain recs and importance of smaller meals for duration of pregnancy and postpartum.  Pt plans to breastfeed and will get [facility] transferred from [place] asap. Follow up if referred.  Cy Blamer, RD Pt does not drink soy milk or lactaid, dislikes the texture and consistency.

## 2011-07-10 NOTE — Progress Notes (Signed)
Has UTI-on Abx, Has had most labs in [place]--May go to [facility]

## 2011-07-14 ENCOUNTER — Inpatient Hospital Stay (HOSPITAL_COMMUNITY)
Admission: AD | Admit: 2011-07-14 | Discharge: 2011-07-14 | Discharge status: Against Medical Advice | Payer: Medicaid Other | Source: Ambulatory Visit | Attending: Obstetrics & Gynecology | Admitting: Obstetrics & Gynecology

## 2011-07-14 ENCOUNTER — Encounter (HOSPITAL_COMMUNITY): Payer: Self-pay | Admitting: *Deleted

## 2011-07-14 ENCOUNTER — Other Ambulatory Visit: Payer: Self-pay

## 2011-07-14 DIAGNOSIS — R61 Generalized hyperhidrosis: Secondary | ICD-10-CM | POA: Insufficient documentation

## 2011-07-14 DIAGNOSIS — O212 Late vomiting of pregnancy: Secondary | ICD-10-CM | POA: Insufficient documentation

## 2011-07-14 DIAGNOSIS — O99891 Other specified diseases and conditions complicating pregnancy: Secondary | ICD-10-CM | POA: Insufficient documentation

## 2011-07-14 DIAGNOSIS — N949 Unspecified condition associated with female genital organs and menstrual cycle: Secondary | ICD-10-CM | POA: Insufficient documentation

## 2011-07-14 DIAGNOSIS — R0602 Shortness of breath: Secondary | ICD-10-CM | POA: Insufficient documentation

## 2011-07-14 LAB — CBC
Hemoglobin: 13 g/dL (ref 12.0–15.0)
MCH: 30.9 pg (ref 26.0–34.0)
RBC: 4.21 MIL/uL (ref 3.87–5.11)
WBC: 17 10*3/uL — ABNORMAL HIGH (ref 4.0–10.5)

## 2011-07-14 LAB — COMPREHENSIVE METABOLIC PANEL
AST: 15 U/L (ref 0–37)
Albumin: 3 g/dL — ABNORMAL LOW (ref 3.5–5.2)
CO2: 23 mEq/L (ref 19–32)
Calcium: 9.3 mg/dL (ref 8.4–10.5)
Creatinine, Ser: 0.54 mg/dL (ref 0.50–1.10)
GFR calc non Af Amer: >90 mL/min (ref >90)

## 2011-07-14 LAB — CARDIAC PANEL(CRET KIN+CKTOT+MB+TROPI)
Relative Index: INVALID (ref 0.0–2.5)
Total CK: 39 U/L (ref 7–177)
Troponin I: <0.3 ng/mL (ref <0.30)

## 2011-07-14 NOTE — MAU Provider Note (Signed)
History     CSN: [id number]  Arrival date and time: 07/14/11 1843   First Provider Initiated Contact with Patient 07/14/11 1921      No chief complaint on file.  HPI Patient is a 26 yo G4P0030 at [redacted]w[redacted]d by [facility] [place] presenting for "feeling like my cervix is changing" as well as new onset vomiting. Patient has a past medical history of thrombocytopenia, congenital heart murmur (unknown diagnosis) and prenatal care in [place]. She states she has chest pain daily, and what she is experiencing now is not new but patient's pulse ox is not accurately measuring her pulse. She states she feels like her cervix is changing because she has had 3 prior abortions and what she is feeling now feels the same as what she experienced then. She has not had vomiting during her pregnancy but has recently started vomiting. No sick contacts. Patient denies vaginal bleeding, discharge, LOF. States the baby is moving, but less than before.   She was seen in our clinic last week. Records were faxed from [place], but they were not completely uploaded to Epic. Recently treated with Macrobid for symptomatic UTI.  OB History    Grav Para Term Preterm Abortions TAB SAB Ect Mult Living   4    3 3           Past Medical History  Diagnosis Date  . Heart murmur     born with condition  . Chlamydia     Past Surgical History  Procedure Date  . Boil     facial boil i&d  . Dilation and curettage of uterus     abortion x3    Family History  Problem Relation Age of Onset  . Emphysema Mother   . Heart attack Father   . Heart disease Father   . Hypertension Father   . Cancer Maternal Grandmother   . Heart attack Paternal Grandmother   . Thyroid disease Paternal Grandmother   . Stroke Maternal Grandfather   . Hypertension Paternal Grandfather     History  Substance Use Topics  . Smoking status: Current Everyday Smoker -- 0.2 packs/day    Types: Cigarettes  . Smokeless tobacco: Not on file  .  Alcohol Use: No    Allergies:  Allergies  Allergen Reactions  . Penicillins Other (See Comments)    Childhood allergy; reaction unknown    Prescriptions prior to admission  Medication Sig Dispense Refill  . nitrofurantoin, macrocrystal-monohydrate, (MACROBID) 100 MG capsule Take 100 mg by mouth 2 (two) times daily.      . prenatal vitamin w/FE, FA (PRENATAL 1 + 1) 27-1 MG TABS Take 1 tablet by mouth daily.          Review of Systems  Constitutional: Negative for fever and chills.  Respiratory: Negative for shortness of breath.   Cardiovascular: Positive for chest pain and palpitations.  Gastrointestinal: Positive for nausea and vomiting.  Genitourinary: Negative for dysuria.  Musculoskeletal: Positive for back pain.  Skin: Negative for rash.  Neurological: Negative for dizziness and headaches.   Physical Exam   Blood pressure 121/75, pulse 143, temperature 98 F (36.7 C), temperature source Oral, resp. rate 16, height 5\' 6"  (1.676 m), weight 103.59 kg (228 lb 6 oz), last menstrual period 11/06/2010, SpO2 98.00%.  Physical Exam  Constitutional: She is oriented to person, place, and time. She appears well-developed and well-nourished. No distress.  HENT:  Head: Normocephalic and atraumatic.  Neck: Normal range of motion.  Cardiovascular: Normal  pulses.  An irregularly irregular rhythm present.  Murmur heard.      Tachy/brady with palpitations. Murmur noted but difficult for me to characterize  Respiratory: Effort normal and breath sounds normal. She has no wheezes.  GI: Soft. There is no tenderness.       Gravid. Toco in place.  Musculoskeletal: Normal range of motion. She exhibits no tenderness.  Neurological: She is alert and oriented to person, place, and time. No cranial nerve deficit.  Skin: No rash noted. She is diaphoretic.    MAU Course  Procedures No results found for this or any previous visit (from the past 24 hour(s)).  MDM Irregular heart rate. Difficult  to trace on pulse ox. Will get 12 lead EKG Patient will need cervical check  Assessment and Plan  26 yo G4P0030 presenting for vaginal pain and vomiting, diaphoresis, and shortness of breath   Ayala, Pamela 07/14/2011, 7:29 PM   Pt seen and examined. Pelvic pressure is closed, short, posterior, and high.  No evidence of labor.   FHT 125 baseline, 15 x 15 accels, no decels, moderate variability. No obstetrical issue. Pr having SOB and diaphoretic.  IRIR heart beat.  Pt has had echo and shows regurgitation of one valve but she is unsure which one.   History of thrombocytopenia  Pt will be transferred to [facility] for evaluation of her heart and SOB. Dr. Judd Ayala accepted patient.

## 2011-07-14 NOTE — MAU Note (Signed)
Patient signed out AMA refusing to be transferred to [facility] ED for further evaluation and treatment of heart condition. Patient also refuses to stay for lab results. Dr. Penne Lash discussed the benefits of being transferred as well as the risks of going home without further evaluation. Patient verbalizes understanding and AMA consent signed.

## 2011-07-16 ENCOUNTER — Ambulatory Visit (INDEPENDENT_AMBULATORY_CARE_PROVIDER_SITE_OTHER): Payer: Self-pay | Admitting: Advanced Practice Midwife

## 2011-07-16 VITALS — BP 126/78 | Temp 98.2°F | Wt 228.5 lb

## 2011-07-16 DIAGNOSIS — I499 Cardiac arrhythmia, unspecified: Secondary | ICD-10-CM

## 2011-07-16 DIAGNOSIS — I2510 Atherosclerotic heart disease of native coronary artery without angina pectoris: Secondary | ICD-10-CM

## 2011-07-16 DIAGNOSIS — O360990 Maternal care for other rhesus isoimmunization, unspecified trimester, not applicable or unspecified: Secondary | ICD-10-CM

## 2011-07-16 DIAGNOSIS — O99330 Smoking (tobacco) complicating pregnancy, unspecified trimester: Secondary | ICD-10-CM

## 2011-07-16 DIAGNOSIS — O99419 Diseases of the circulatory system complicating pregnancy, unspecified trimester: Secondary | ICD-10-CM

## 2011-07-16 DIAGNOSIS — Z3400 Encounter for supervision of normal first pregnancy, unspecified trimester: Secondary | ICD-10-CM

## 2011-07-16 LAB — POCT URINALYSIS DIP (DEVICE)
Glucose, UA: NEGATIVE mg/dL
Hgb urine dipstick: NEGATIVE
Ketones, ur: NEGATIVE mg/dL
Specific Gravity, Urine: 1.025 (ref 1.005–1.030)
Urobilinogen, UA: 1 mg/dL (ref 0.0–1.0)

## 2011-07-16 NOTE — Progress Notes (Signed)
P=, c/o edema in feet/ankles., hands, fingers, c/o pelvic pressure, came to MAU 07/14/11 for c/o baby not moving like usual, and vomiting. States was given ekg and was told it was irregular- refused transfer to ER. Decline flu shot, states got TDAP already at other provider.

## 2011-07-17 LAB — GC/CHLAMYDIA PROBE AMP, GENITAL
Chlamydia, DNA Probe: NEGATIVE
GC Probe Amp, Genital: NEGATIVE

## 2011-07-20 LAB — CULTURE, BETA STREP (GROUP B ONLY)

## 2011-07-23 ENCOUNTER — Ambulatory Visit (INDEPENDENT_AMBULATORY_CARE_PROVIDER_SITE_OTHER): Payer: Self-pay | Admitting: Physician Assistant

## 2011-07-23 VITALS — BP 122/83 | Temp 97.3°F | Wt 228.1 lb

## 2011-07-23 DIAGNOSIS — I2510 Atherosclerotic heart disease of native coronary artery without angina pectoris: Secondary | ICD-10-CM

## 2011-07-23 DIAGNOSIS — D696 Thrombocytopenia, unspecified: Secondary | ICD-10-CM

## 2011-07-23 DIAGNOSIS — R011 Cardiac murmur, unspecified: Secondary | ICD-10-CM

## 2011-07-23 DIAGNOSIS — R9431 Abnormal electrocardiogram [ECG] [EKG]: Secondary | ICD-10-CM

## 2011-07-23 DIAGNOSIS — Z3400 Encounter for supervision of normal first pregnancy, unspecified trimester: Secondary | ICD-10-CM

## 2011-07-23 LAB — POCT URINALYSIS DIP (DEVICE)
Bilirubin Urine: NEGATIVE
Glucose, UA: NEGATIVE mg/dL
Leukocytes, UA: NEGATIVE
Nitrite: NEGATIVE
Urobilinogen, UA: 1 mg/dL (ref 0.0–1.0)

## 2011-07-23 NOTE — Patient Instructions (Signed)
Pregnancy - Third Trimester The third trimester of pregnancy (the last 3 months) is a period of the most rapid growth for you and your baby. The baby approaches a length of 20 inches and a weight of 6 to 10 pounds. The baby is adding on fat and getting ready for life outside your body. While inside, babies have periods of sleeping and waking, suck their thumbs, and hiccups. You can often feel small contractions of the uterus. This is false labor. It is also called Braxton-Hicks contractions. This is like a practice for labor. The usual problems in this stage of pregnancy include more difficulty breathing, swelling of the hands and feet from water retention, and having to urinate more often because of the uterus and baby pressing on your bladder.  PRENATAL EXAMS  Blood work may continue to be done during prenatal exams. These tests are done to check on your health and the probable health of your baby. Blood work is used to follow your blood levels (hemoglobin). Anemia (low hemoglobin) is common during pregnancy. Iron and vitamins are given to help prevent this. You may also continue to be checked for diabetes. Some of the past blood tests may be done again.   The size of the uterus is measured during each visit. This makes sure your baby is growing properly according to your pregnancy dates.   Your blood pressure is checked every prenatal visit. This is to make sure you are not getting toxemia.   Your urine is checked every prenatal visit for infection, diabetes and protein.   Your weight is checked at each visit. This is done to make sure gains are happening at the suggested rate and that you and your baby are growing normally.   Sometimes, an ultrasound is performed to confirm the position and the proper growth and development of the baby. This is a test done that bounces harmless sound waves off the baby so your caregiver can more accurately determine due dates.   Discuss the type of pain  medication and anesthesia you will have during your labor and delivery.   Discuss the possibility and anesthesia if a Cesarean Section might be necessary.   Inform your caregiver if there is any mental or physical violence at home.  Sometimes, a specialized non-stress test, contraction stress test and biophysical profile are done to make sure the baby is not having a problem. Checking the amniotic fluid surrounding the baby is called an amniocentesis. The amniotic fluid is removed by sticking a needle into the belly (abdomen). This is sometimes done near the end of pregnancy if an early delivery is required. In this case, it is done to help make sure the baby's lungs are mature enough for the baby to live outside of the womb. If the lungs are not mature and it is unsafe to deliver the baby, an injection of cortisone medication is given to the mother 1 to 2 days before the delivery. This helps the baby's lungs mature and makes it safer to deliver the baby. CHANGES OCCURING IN THE THIRD TRIMESTER OF PREGNANCY Your body goes through many changes during pregnancy. They vary from person to person. Talk to your caregiver about changes you notice and are concerned about.  During the last trimester, you have probably had an increase in your appetite. It is normal to have cravings for certain foods. This varies from person to person and pregnancy to pregnancy.   You may begin to get stretch marks on your hips,   abdomen, and breasts. These are normal changes in the body during pregnancy. There are no exercises or medications to take which prevent this change.   Constipation may be treated with a stool softener or adding bulk to your diet. Drinking lots of fluids, fiber in vegetables, fruits, and whole grains are helpful.   Exercising is also helpful. If you have been very active up until your pregnancy, most of these activities can be continued during your pregnancy. If you have been less active, it is helpful  to start an exercise program such as walking. Consult your caregiver before starting exercise programs.   Avoid all smoking, alcohol, un-prescribed drugs, herbs and "street drugs" during your pregnancy. These chemicals affect the formation and growth of the baby. Avoid chemicals throughout the pregnancy to ensure the delivery of a healthy infant.   Backache, varicose veins and hemorrhoids may develop or get worse.   You will tire more easily in the third trimester, which is normal.   The baby's movements may be stronger and more often.   You may become short of breath easily.   Your belly button may stick out.   A yellow discharge may leak from your breasts called colostrum.   You may have a bloody mucus discharge. This usually occurs a few days to a week before labor begins.  HOME CARE INSTRUCTIONS   Keep your caregiver's appointments. Follow your caregiver's instructions regarding medication use, exercise, and diet.   During pregnancy, you are providing food for you and your baby. Continue to eat regular, well-balanced meals. Choose foods such as meat, fish, milk and other low fat dairy products, vegetables, fruits, and whole-grain breads and cereals. Your caregiver will tell you of the ideal weight gain.   A physical sexual relationship may be continued throughout pregnancy if there are no other problems such as early (premature) leaking of amniotic fluid from the membranes, vaginal bleeding, or belly (abdominal) pain.   Exercise regularly if there are no restrictions. Check with your caregiver if you are unsure of the safety of your exercises. Greater weight gain will occur in the last 2 trimesters of pregnancy. Exercising helps:   Control your weight.   Get you in shape for labor and delivery.   You lose weight after you deliver.   Rest a lot with legs elevated, or as needed for leg cramps or low back pain.   Wear a good support or jogging bra for breast tenderness during  pregnancy. This may help if worn during sleep. Pads or tissues may be used in the bra if you are leaking colostrum.   Do not use hot tubs, steam rooms, or saunas.   Wear your seat belt when driving. This protects you and your baby if you are in an accident.   Avoid raw meat, cat litter boxes and soil used by cats. These carry germs that can cause birth defects in the baby.   It is easier to loose urine during pregnancy. Tightening up and strengthening the pelvic muscles will help with this problem. You can practice stopping your urination while you are going to the bathroom. These are the same muscles you need to strengthen. It is also the muscles you would use if you were trying to stop from passing gas. You can practice tightening these muscles up 10 times a set and repeating this about 3 times per day. Once you know what muscles to tighten up, do not perform these exercises during urination. It is more likely   to cause an infection by backing up the urine.   Ask for help if you have financial, counseling or nutritional needs during pregnancy. Your caregiver will be able to offer counseling for these needs as well as refer you for other special needs.   Make a list of emergency phone numbers and have them available.   Plan on getting help from family or friends when you go home from the hospital.   Make a trial run to the hospital.   Take prenatal classes with the father to understand, practice and ask questions about the labor and delivery.   Prepare the baby's room/nursery.   Do not travel out of the city unless it is absolutely necessary and with the advice of your caregiver.   Wear only low or no heal shoes to have better balance and prevent falling.  MEDICATIONS AND DRUG USE IN PREGNANCY  Take prenatal vitamins as directed. The vitamin should contain 1 milligram of folic acid. Keep all vitamins out of reach of children. Only a couple vitamins or tablets containing iron may be fatal  to a baby or young child when ingested.   Avoid use of all medications, including herbs, over-the-counter medications, not prescribed or suggested by your caregiver. Only take over-the-counter or prescription medicines for pain, discomfort, or fever as directed by your caregiver. Do not use aspirin, ibuprofen (Motrin, Advil, Nuprin) or naproxen (Aleve) unless OK'd by your caregiver.   Let your caregiver also know about herbs you may be using.   Alcohol is related to a number of birth defects. This includes fetal alcohol syndrome. All alcohol, in any form, should be avoided completely. Smoking will cause low birth rate and premature babies.   Street/illegal drugs are very harmful to the baby. They are absolutely forbidden. A baby born to an addicted mother will be addicted at birth. The baby will go through the same withdrawal an adult does.  SEEK MEDICAL CARE IF: You have any concerns or worries during your pregnancy. It is better to call with your questions if you feel they cannot wait, rather than worry about them. DECISIONS ABOUT CIRCUMCISION You may or may not know the sex of your baby. If you know your baby is a boy, it may be time to think about circumcision. Circumcision is the removal of the foreskin of the penis. This is the skin that covers the sensitive end of the penis. There is no proven medical need for this. Often this decision is made on what is popular at the time or based upon religious beliefs and social issues. You can discuss these issues with your caregiver or pediatrician. SEEK IMMEDIATE MEDICAL CARE IF:   An unexplained oral temperature above 102 F (38.9 C) develops, or as your caregiver suggests.   You have leaking of fluid from the vagina (birth canal). If leaking membranes are suspected, take your temperature and tell your caregiver of this when you call.   There is vaginal spotting, bleeding or passing clots. Tell your caregiver of the amount and how many pads are  used.   You develop a bad smelling vaginal discharge with a change in the color from clear to white.   You develop vomiting that lasts more than 24 hours.   You develop chills or fever.   You develop shortness of breath.   You develop burning on urination.   You loose more than 2 pounds of weight or gain more than 2 pounds of weight or as suggested by your   caregiver.   You notice sudden swelling of your face, hands, and feet or legs.   You develop belly (abdominal) pain. Round ligament discomfort is a common non-cancerous (benign) cause of abdominal pain in pregnancy. Your caregiver still must evaluate you.   You develop a severe headache that does not go away.   You develop visual problems, blurred or double vision.   If you have not felt your baby move for more than 1 hour. If you think the baby is not moving as much as usual, eat something with sugar in it and lie down on your left side for an hour. The baby should move at least 4 to 5 times per hour. Call right away if your baby moves less than that.   You fall, are in a car accident or any kind of trauma.   There is mental or physical violence at home.  Document Released: 04/01/2001 Document Revised: 03/27/2011 Document Reviewed: 10/04/2008 [employer] Patient Information 2012 [employer]. 

## 2011-07-23 NOTE — Progress Notes (Signed)
Pulse-94   Edema- hands, feet, legs  Pain-"when spread legs" Pressure-pelvic Vaginal discharge- clear, white colored

## 2011-07-23 NOTE — Progress Notes (Signed)
Pt was seen in [facility] 07/14/11 and had an abnormal EKG, symptomatic and refused transfer to [facility]. Known hx of heart murmur. Had ECHO in 02/2012 that was normal. Previous EKG 5/12 essentially same as 3/25. Holter monitor order and Cardiology referral for further evaluation prior to deliver to insure no need for cardiac monitoring, ABX in labor, or modification in mode of delivery. Pt is asymptomatic at today's visit. Reports daily palpitations with activity.

## 2011-07-25 ENCOUNTER — Telehealth: Payer: Self-pay

## 2011-07-30 ENCOUNTER — Encounter: Payer: Self-pay | Admitting: Internal Medicine

## 2011-07-30 ENCOUNTER — Ambulatory Visit (INDEPENDENT_AMBULATORY_CARE_PROVIDER_SITE_OTHER): Payer: Self-pay | Admitting: Advanced Practice Midwife

## 2011-07-30 ENCOUNTER — Ambulatory Visit (INDEPENDENT_AMBULATORY_CARE_PROVIDER_SITE_OTHER): Payer: Medicaid Other

## 2011-07-30 VITALS — BP 125/82 | Temp 97.1°F | Wt 229.9 lb

## 2011-07-30 DIAGNOSIS — R002 Palpitations: Secondary | ICD-10-CM

## 2011-07-30 DIAGNOSIS — Z3400 Encounter for supervision of normal first pregnancy, unspecified trimester: Secondary | ICD-10-CM

## 2011-07-30 DIAGNOSIS — I499 Cardiac arrhythmia, unspecified: Secondary | ICD-10-CM

## 2011-07-30 DIAGNOSIS — O99419 Diseases of the circulatory system complicating pregnancy, unspecified trimester: Secondary | ICD-10-CM

## 2011-07-30 LAB — POCT URINALYSIS DIP (DEVICE)
Glucose, UA: NEGATIVE mg/dL
Leukocytes, UA: NEGATIVE
Nitrite: NEGATIVE

## 2011-07-30 NOTE — Progress Notes (Signed)
Pulse: 65

## 2011-07-30 NOTE — Patient Instructions (Signed)
Please go to [facility] today when you leave the office. They are located at [address] on the 3rd floor. You are going to have a Holter Monitor placed today and then [place] will determine when you will see the cardiologist.

## 2011-07-30 NOTE — Progress Notes (Signed)
Oxygen sat 99-100% while walking, 97-99 when resting

## 2011-07-30 NOTE — Progress Notes (Signed)
Orthostatic BPs Laying - 110/70 Sitting - 100/60 Standing - 90/55

## 2011-07-31 ENCOUNTER — Encounter: Payer: Self-pay | Admitting: Family Medicine

## 2011-07-31 ENCOUNTER — Telehealth: Payer: Self-pay | Admitting: *Deleted

## 2011-07-31 DIAGNOSIS — O99321 Drug use complicating pregnancy, first trimester: Secondary | ICD-10-CM | POA: Insufficient documentation

## 2011-07-31 DIAGNOSIS — I493 Ventricular premature depolarization: Secondary | ICD-10-CM | POA: Insufficient documentation

## 2011-07-31 NOTE — Progress Notes (Signed)
Seen in [facility] 07/14/11 for decreased FM. Was pale and diaphoretic per Dr. Penne Lash. Irreg heartbeat. ECG abnormal. Recommended transfer to [facility] for cardiology, declined. Feeling better today, but reports frequent palpitations, being SOB w/ short walks, near-syncopal episodes in pregnancy, hx of syncopal episodes as teen-ager. Has seen cardiologist few times in past, did not continue care. Cardiology referral made today. GBS, GC/CT. Instructed to go to [facility] ED for chest pain, syncope or worsening SOB.

## 2011-07-31 NOTE — Telephone Encounter (Signed)
Patient came in for a 24 hour monitor yesterday.  Advised patient to bring back today. As of 4:00 pm today monitor had not been returned. Did call back and left message we needed to get it back as soon as possible.  As of 6:11 still no monitor

## 2011-07-31 NOTE — Progress Notes (Signed)
Addended by: Dorathy Kinsman on: 07/31/2011 10:58 PM   Modules accepted: Level of Service

## 2011-07-31 NOTE — Progress Notes (Signed)
Reports not being able to schedule cardiology appt and Holter monitoring. States she called 4/5 and 4/8. Increase SOB w/ walking. Instructed to limit activities that exacerbate Sx. [facility]. Unable to reach pt x three calls. Pt may come pick up Holter monitor now. Growth [place] per Dr. Shawnie Pons. No other antenatal testing needed if normal.

## 2011-08-04 ENCOUNTER — Ambulatory Visit (INDEPENDENT_AMBULATORY_CARE_PROVIDER_SITE_OTHER): Payer: Self-pay | Admitting: Family Medicine

## 2011-08-04 VITALS — BP 97/59 | Temp 97.6°F | Wt 232.1 lb

## 2011-08-04 DIAGNOSIS — D696 Thrombocytopenia, unspecified: Secondary | ICD-10-CM

## 2011-08-04 DIAGNOSIS — O99419 Diseases of the circulatory system complicating pregnancy, unspecified trimester: Secondary | ICD-10-CM

## 2011-08-04 LAB — POCT URINALYSIS DIP (DEVICE)
Glucose, UA: NEGATIVE mg/dL
Ketones, ur: NEGATIVE mg/dL
Leukocytes, UA: NEGATIVE
Specific Gravity, Urine: 1.015 (ref 1.005–1.030)

## 2011-08-04 NOTE — Progress Notes (Signed)
Pt. Had Holter last week, results are not available. Feels like a good size baby. Labor precautions given.

## 2011-08-04 NOTE — Telephone Encounter (Signed)
Patient had monitor 

## 2011-08-04 NOTE — Patient Instructions (Addendum)
Normal Labor and Delivery Your caregiver must first be sure you are in labor. Signs of labor include:  You may pass what is called "the mucus plug" before labor begins. This is a small amount of blood stained mucus.   Regular uterine contractions.   The time between contractions get closer together.   The discomfort and pain gradually gets more intense.   Pains are mostly located in the back.   Pains get worse when walking.   The cervix (the opening of the uterus becomes thinner (begins to efface) and opens up (dilates).  Once you are in labor and admitted into the hospital or care center, your caregiver will do the following:  A complete physical examination.   Check your vital signs (blood pressure, pulse, temperature and the fetal heart rate).   Do a vaginal examination (using a sterile glove and lubricant) to determine:   The position (presentation) of the baby (head [vertex] or buttock first).   The level (station) of the baby's head in the birth canal.   The effacement and dilatation of the cervix.   You may have your pubic hair shaved and be given an enema depending on your caregiver and the circumstance.   An electronic monitor is usually placed on your abdomen. The monitor follows the length and intensity of the contractions, as well as the baby's heart rate.   Usually, your caregiver will insert an IV in your arm with a bottle of sugar water. This is done as a precaution so that medications can be given to you quickly during labor or delivery.  NORMAL LABOR AND DELIVERY IS DIVIDED UP INTO 3 STAGES: First Stage This is when regular contractions begin and the cervix begins to efface and dilate. This stage can last from 3 to 15 hours. The end of the first stage is when the cervix is 100% effaced and 10 centimeters dilated. Pain medications may be given by   Injection (morphine, demerol, etc.)   Regional anesthesia (spinal, caudal or epidural, anesthetics given in  different locations of the spine). Paracervical pain medication may be given, which is an injection of and anesthetic on each side of the cervix.  A pregnant woman may request to have "Natural Childbirth" which is not to have any medications or anesthesia during her labor and delivery. Second Stage This is when the baby comes down through the birth canal (vagina) and is born. This can take 1 to 4 hours. As the baby's head comes down through the birth canal, you may feel like you are going to have a bowel movement. You will get the urge to bear down and push until the baby is delivered. As the baby's head is being delivered, the caregiver will decide if an episiotomy (a cut in the perineum and vagina area) is needed to prevent tearing of the tissue in this area. The episiotomy is sewn up after the delivery of the baby and placenta. Sometimes a mask with nitrous oxide is given for the mother to breath during the delivery of the baby to help if there is too much pain. The end of Stage 2 is when the baby is fully delivered. Then when the umbilical cord stops pulsating it is clamped and cut. Third Stage The third stage begins after the baby is completely delivered and ends after the placenta (afterbirth) is delivered. This usually takes 5 to 30 minutes. After the placenta is delivered, a medication is given either by intravenous or injection to help contract   the uterus and prevent bleeding. The third stage is not painful and pain medication is usually not necessary. If an episiotomy was done, it is repaired at this time. After the delivery, the mother is watched and monitored closely for 1 to 2 hours to make sure there is no postpartum bleeding (hemorrhage). If there is a lot of bleeding, medication is given to contract the uterus and stop the bleeding. Document Released: 01/15/2008 Document Revised: 03/27/2011 Document Reviewed: 01/15/2008 [employer] Patient Information 2012 [employer]. Contraception  Choices Contraception (birth control) is the use of any methods or devices to prevent pregnancy. Below are some methods to help avoid pregnancy. HORMONAL METHODS   Contraceptive implant. This is a thin, plastic tube containing progesterone hormone. It does not contain estrogen hormone. Your caregiver inserts the tube in the inner part of the upper arm. The tube can remain in place for up to 3 years. After 3 years, the implant must be removed. The implant prevents the ovaries from releasing an egg (ovulation), thickens the cervical mucus which prevents sperm from entering the uterus, and thins the lining of the inside of the uterus.   Progesterone-only injections. These injections are given every 3 months by your caregiver to prevent pregnancy. This synthetic progesterone hormone stops the ovaries from releasing eggs. It also thickens cervical mucus and changes the uterine lining. This makes it harder for sperm to survive in the uterus.   Birth control pills. These pills contain estrogen and progesterone hormone. They work by stopping the egg from forming in the ovary (ovulation). Birth control pills are prescribed by a caregiver.Birth control pills can also be used to treat heavy periods.   Minipill. This type of birth control pill contains only the progesterone hormone. They are taken every day of each month and must be prescribed by your caregiver.   Birth control patch. The patch contains hormones similar to those in birth control pills. It must be changed once a week and is prescribed by a caregiver.   Vaginal ring. The ring contains hormones similar to those in birth control pills. It is left in the vagina for 3 weeks, removed for 1 week, and then a new one is put back in place. The patient must be comfortable inserting and removing the ring from the vagina.A caregiver's prescription is necessary.   Emergency contraception. Emergency contraceptives prevent pregnancy after unprotected sexual  intercourse. This pill can be taken right after sex or up to 5 days after unprotected sex. It is most effective the sooner you take the pills after having sexual intercourse. Emergency contraceptive pills are available without a prescription. Check with your pharmacist. Do not use emergency contraception as your only form of birth control.  BARRIER METHODS   Female condom. This is a thin sheath (latex or rubber) that is worn over the penis during sexual intercourse. It can be used with spermicide to increase effectiveness.   Female condom. This is a soft, loose-fitting sheath that is put into the vagina before sexual intercourse.   Diaphragm. This is a soft, latex, dome-shaped barrier that must be fitted by a caregiver. It is inserted into the vagina, along with a spermicidal jelly. It is inserted before intercourse. The diaphragm should be left in the vagina for 6 to 8 hours after intercourse.   Cervical cap. This is a round, soft, latex or plastic cup that fits over the cervix and must be fitted by a caregiver. The cap can be left in place for up to   48 hours after intercourse.   Sponge. This is a soft, circular piece of polyurethane foam. The sponge has spermicide in it. It is inserted into the vagina after wetting it and before sexual intercourse.   Spermicides. These are chemicals that kill or block sperm from entering the cervix and uterus. They come in the form of creams, jellies, suppositories, foam, or tablets. They do not require a prescription. They are inserted into the vagina with an applicator before having sexual intercourse. The process must be repeated every time you have sexual intercourse.  INTRAUTERINE CONTRACEPTION  Intrauterine device (IUD). This is a T-shaped device that is put in a woman's uterus during a menstrual period to prevent pregnancy. There are 2 types:   Copper IUD. This type of IUD is wrapped in copper wire and is placed inside the uterus. Copper makes the uterus and  fallopian tubes produce a fluid that kills sperm. It can stay in place for 10 years.   Hormone IUD. This type of IUD contains the hormone progestin (synthetic progesterone). The hormone thickens the cervical mucus and prevents sperm from entering the uterus, and it also thins the uterine lining to prevent implantation of a fertilized egg. The hormone can weaken or kill the sperm that get into the uterus. It can stay in place for 5 years.  PERMANENT METHODS OF CONTRACEPTION  Female tubal ligation. This is when the woman's fallopian tubes are surgically sealed, tied, or blocked to prevent the egg from traveling to the uterus.   Female sterilization. This is when the female has the tubes that carry sperm tied off (vasectomy).This blocks sperm from entering the vagina during sexual intercourse. After the procedure, the man can still ejaculate fluid (semen).  NATURAL PLANNING METHODS  Natural family planning. This is not having sexual intercourse or using a barrier method (condom, diaphragm, cervical cap) on days the woman could become pregnant.   Calendar method. This is keeping track of the length of each menstrual cycle and identifying when you are fertile.   Ovulation method. This is avoiding sexual intercourse during ovulation.   Symptothermal method. This is avoiding sexual intercourse during ovulation, using a thermometer and ovulation symptoms.   Post-ovulation method. This is timing sexual intercourse after you have ovulated.  Regardless of which type or method of contraception you choose, it is important that you use condoms to protect against the transmission of sexually transmitted diseases (STDs). Talk with your caregiver about which form of contraception is most appropriate for you. Document Released: 04/07/2005 Document Revised: 03/27/2011 Document Reviewed: 08/14/2010 [employer] Patient Information 2012 [employer]. Breastfeeding BENEFITS OF BREASTFEEDING For the baby  The first  milk (colostrum) helps the baby's digestive system function better.   There are antibodies from the mother in the milk that help the baby fight off infections.   The baby has a lower incidence of asthma, allergies, and SIDS (sudden infant death syndrome).   The nutrients in breast milk are better than formulas for the baby and helps the baby's brain grow better.   Babies who breastfeed have less gas, colic, and constipation.  For the mother  Breastfeeding helps develop a very special bond between mother and baby.   It is more convenient, always available at the correct temperature and cheaper than formula feeding.   It burns calories in the mother and helps with losing weight that was gained during pregnancy.   It makes the uterus contract back down to normal size faster and slows bleeding following delivery.     Breastfeeding mothers have a lower risk of developing breast cancer.  NURSE FREQUENTLY  A healthy, full-term baby may breastfeed as often as every hour or space his or her feedings to every 3 hours.   How often to nurse will vary from baby to baby. Watch your baby for signs of hunger, not the clock.   Nurse as often as the baby requests, or when you feel the need to reduce the fullness of your breasts.   Awaken the baby if it has been 3 to 4 hours since the last feeding.   Frequent feeding will help the mother make more milk and will prevent problems like sore nipples and engorgement of the breasts.  BABY'S POSITION AT THE BREAST  Whether lying down or sitting, be sure that the baby's tummy is facing your tummy.   Support the breast with 4 fingers underneath the breast and the thumb above. Make sure your fingers are well away from the nipple and baby's mouth.   Stroke the baby's lips and cheek closest to the breast gently with your finger or nipple.   When the baby's mouth is open wide enough, place all of your nipple and as much of the dark area around the nipple as  possible into your baby's mouth.   Pull the baby in close so the tip of the nose and the baby's cheeks touch the breast during the feeding.  FEEDINGS  The length of each feeding varies from baby to baby and from feeding to feeding.   The baby must suck about 2 to 3 minutes for your milk to get to him or her. This is called a "let down." For this reason, allow the baby to feed on each breast as long as he or she wants. Your baby will end the feeding when he or she has received the right balance of nutrients.   To break the suction, put your finger into the corner of the baby's mouth and slide it between his or her gums before removing your breast from his or her mouth. This will help prevent sore nipples.  REDUCING BREAST ENGORGEMENT  In the first week after your baby is born, you may experience signs of breast engorgement. When breasts are engorged, they feel heavy, warm, full, and may be tender to the touch. You can reduce engorgement if you:   Nurse frequently, every 2 to 3 hours. Mothers who breastfeed early and often have fewer problems with engorgement.   Place light ice packs on your breasts between feedings. This reduces swelling. Wrap the ice packs in a lightweight towel to protect your skin.   Apply moist hot packs to your breast for 5 to 10 minutes before each feeding. This increases circulation and helps the milk flow.   Gently massage your breast before and during the feeding.   Make sure that the baby empties at least one breast at every feeding before switching sides.   Use a breast pump to empty the breasts if your baby is sleepy or not nursing well. You may also want to pump if you are returning to work or or you feel you are getting engorged.   Avoid bottle feeds, pacifiers or supplemental feedings of water or juice in place of breastfeeding.   Be sure the baby is latched on and positioned properly while breastfeeding.   Prevent fatigue, stress, and anemia.   Wear a  supportive bra, avoiding underwire styles.   Eat a balanced diet with enough fluids.    If you follow these suggestions, your engorgement should improve in 24 to 48 hours. If you are still experiencing difficulty, call your lactation consultant or caregiver. IS MY BABY GETTING ENOUGH MILK? Sometimes, mothers worry about whether their babies are getting enough milk. You can be assured that your baby is getting enough milk if:  The baby is actively sucking and you hear swallowing.   The baby nurses at least 8 to 12 times in a 24 hour time period. Nurse your baby until he or she unlatches or falls asleep at the first breast (at least 10 to 20 minutes), then offer the second side.   The baby is wetting 5 to 6 disposable diapers (6 to 8 cloth diapers) in a 24 hour period by 5 to 6 days of age.   The baby is having at least 2 to 3 stools every 24 hours for the first few months. Breast milk is all the food your baby needs. It is not necessary for your baby to have water or formula. In fact, to help your breasts make more milk, it is best not to give your baby supplemental feedings during the early weeks.   The stool should be soft and yellow.   The baby should gain 4 to 7 ounces per week after he is 4 days old.  TAKE CARE OF YOURSELF Take care of your breasts by:  Bathing or showering daily.   Avoiding the use of soaps on your nipples.   Start feedings on your left breast at one feeding and on your right breast at the next feeding.   You will notice an increase in your milk supply 2 to 5 days after delivery. You may feel some discomfort from engorgement, which makes your breasts very firm and often tender. Engorgement "peaks" out within 24 to 48 hours. In the meantime, apply warm moist towels to your breasts for 5 to 10 minutes before feeding. Gentle massage and expression of some milk before feeding will soften your breasts, making it easier for your baby to latch on. Wear a well fitting nursing  bra and air dry your nipples for 10 to 15 minutes after each feeding.   Only use cotton bra pads.   Only use pure lanolin on your nipples after nursing. You do not need to wash it off before nursing.  Take care of yourself by:   Eating well-balanced meals and nutritious snacks.   Drinking milk, fruit juice, and water to satisfy your thirst (about 8 glasses a day).   Getting plenty of rest.   Increasing calcium in your diet (1200 mg a day).   Avoiding foods that you notice affect the baby in a bad way.  SEEK MEDICAL CARE IF:   You have any questions or difficulty with breastfeeding.   You need help.   You have a hard, red, sore area on your breast, accompanied by a fever of 100.5 F (38.1 C) or more.   Your baby is too sleepy to eat well or is having trouble sleeping.   Your baby is wetting less than 6 diapers per day, by 5 days of age.   Your baby's skin or white part of his or her eyes is more yellow than it was in the hospital.   You feel depressed.  Document Released: 04/07/2005 Document Revised: 03/27/2011 Document Reviewed: 11/20/2008 [employer] Patient Information 2012 [employer]. 

## 2011-08-04 NOTE — Progress Notes (Signed)
Pulse: 64

## 2011-08-05 ENCOUNTER — Ambulatory Visit (INDEPENDENT_AMBULATORY_CARE_PROVIDER_SITE_OTHER): Payer: Self-pay | Admitting: Internal Medicine

## 2011-08-05 ENCOUNTER — Other Ambulatory Visit: Payer: Self-pay | Admitting: *Deleted

## 2011-08-05 ENCOUNTER — Encounter: Payer: Self-pay | Admitting: Internal Medicine

## 2011-08-05 ENCOUNTER — Ambulatory Visit (HOSPITAL_COMMUNITY): Payer: Medicaid Other | Attending: Cardiology

## 2011-08-05 ENCOUNTER — Other Ambulatory Visit: Payer: Self-pay

## 2011-08-05 VITALS — BP 130/70 | HR 82 | Resp 18 | Ht 65.0 in | Wt 237.8 lb

## 2011-08-05 DIAGNOSIS — O99330 Smoking (tobacco) complicating pregnancy, unspecified trimester: Secondary | ICD-10-CM | POA: Insufficient documentation

## 2011-08-05 DIAGNOSIS — I4949 Other premature depolarization: Secondary | ICD-10-CM | POA: Insufficient documentation

## 2011-08-05 DIAGNOSIS — I493 Ventricular premature depolarization: Secondary | ICD-10-CM

## 2011-08-05 DIAGNOSIS — I519 Heart disease, unspecified: Secondary | ICD-10-CM | POA: Insufficient documentation

## 2011-08-05 DIAGNOSIS — R011 Cardiac murmur, unspecified: Secondary | ICD-10-CM | POA: Insufficient documentation

## 2011-08-05 DIAGNOSIS — I2510 Atherosclerotic heart disease of native coronary artery without angina pectoris: Secondary | ICD-10-CM | POA: Insufficient documentation

## 2011-08-05 DIAGNOSIS — R0989 Other specified symptoms and signs involving the circulatory and respiratory systems: Secondary | ICD-10-CM | POA: Insufficient documentation

## 2011-08-05 DIAGNOSIS — R0609 Other forms of dyspnea: Secondary | ICD-10-CM | POA: Insufficient documentation

## 2011-08-05 NOTE — Assessment & Plan Note (Signed)
These are quite problematic and precipitated by stress are tachypalpitations suggestive of runs IE ventricular tachycardia. The reason for the speech is not clear. There unusual axis suggests that there may be a cardiomyopathic process although T wave inversion in the anterior precordium is not noted. Thankfully left ventricular function is normal so there is no evidence yet of a cardiomyopathy related to the PVC burden. MRI evaluation of this has been shown to be helpful in identifying those who are potentially at risk  I will plan to discuss with the OB/GYN clinic implications it relates to delivery. Thereafter, further evaluation of her heart should be undertaken to try to identify the cause of these PVCs.

## 2011-08-05 NOTE — Progress Notes (Signed)
History and Physical  Patient ID: [name] MRN: [id number], SOB: 09/25/1985 25 y.o. Date of Encounter: 08/05/2011, 3:25 PM  Primary Physician: Provider Not In System Primary Cardiologist Chief Complaint: pvc  History of Present Illness: [name] is a 26 y.o. 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  She is a lifelong history of palpitations. These have been accompanied by significant exercise intolerance and stress induced tachycardia palpitations. She has had no syncope. She does have some nocturnal dyspnea but no orthopnea or peripheral edema.  Her family history is notable for her father dying of "a widow maker" his mother also died of a "MI".  She uses cigarettes but not alcohol or recreational drugs.  She had an echo done in [place] in November and she was told it was "normal". We do not have this information earlier and a repeat echo today demonstrated mild ventricular dilatation and near-normal left ventricular function consistent with pregnancy   Past Medical History  Diagnosis Date  . Heart murmur     born with condition  . Chlamydia      Past Surgical History  Procedure Date  . Boil     facial boil i&d  . Dilation and curettage of uterus     abortion x3      Current Outpatient Prescriptions  Medication Sig Dispense Refill  . Prenatal Vit-Fe Fumarate-FA (PRENATAL MULTIVITAMIN) TABS Take 1 tablet by mouth every morning.         Allergies: Allergies  Allergen Reactions  . Penicillins Other (See Comments)    Childhood allergy; reaction unknown     History  Substance Use Topics  . Smoking status: Current Everyday Smoker -- 0.2 packs/day    Types: Cigarettes  . Smokeless tobacco: Not on file  . Alcohol Use: No      Family History  Problem Relation Age of Onset  . Emphysema Mother   . Heart attack Father   . Heart disease Father   . Hypertension Father   .  Cancer Maternal Grandmother   . Heart attack Paternal Grandmother   . Thyroid disease Paternal Grandmother   . Stroke Maternal Grandfather   . Hypertension Paternal Grandfather       ROS:  Please see the history of present illness.     All other systems reviewed and negative.   Vital Signs: Blood pressure 130/70, pulse 82, resp. rate 18, height 5\' 5"  (1.651 m), weight 237 lb 12.8 oz (107.865 kg), last menstrual period 11/06/2010.  PHYSICAL EXAM: General:  Well nourished, well developed female in no acute distress markedly gravid  HEENT: normal Lymph: no adenopathy Neck: no JVD Endocrine:  No thryomegaly Vascular: No carotid bruits; FA pulses 2+ bilaterally without bruits Cardiac:  normal S1, S2; some rapid but not irregular puls Back without kyphosis/scoliosis, no CVA tenderness Lungs:  clear to auscultation bilaterally, no wheezing, rhonchi or rales Abd: soft, nontender, no hepatomegalyGravid Ext: no edema Musculoskeletal:  No deformities, BUE and BLE strength normal and equal Skin: warm and dry Neuro:  CNs 2-12 intact, no focal abnormalities noted Psych:  Normal affect   EKG:  *Electrocardiogram dated March 25 demonstrated sinus rhythm at about 110 beats per minute with intervals of 0.13/0.07/0.3 2 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 about 14-15%. There were no couplets or  runs. Labs:   Lab Results  Component Value Date   WBC 17.0* 07/14/2011   HGB 13.0 07/14/2011   HCT 37.9 07/14/2011   MCV 90.0 07/14/2011   PLT 163 07/14/2011   No results found for this basename: NA,K,CL,CO2,BUN,CREATININE,CALCIUM,LABALBU,PROT,BILITOT,ALKPHOS,ALT,AST,GLUCOSE in the last 168 hours No results found for this basename: CKTOTAL:4,CKMB:4,TROPONINI:4 in the last 72 hours No results found for this basename: CHOL, HDL, LDLCALC, TRIG   No results found for this  basename: DDIMER   BNP No results found for this basename: probnp       ASSESSMENT AND PLAN:

## 2011-08-05 NOTE — Patient Instructions (Signed)
Your physician recommends that you schedule a follow-up appointment in: 4 weeks with Dr Klein    

## 2011-08-06 ENCOUNTER — Other Ambulatory Visit (HOSPITAL_COMMUNITY): Payer: Self-pay

## 2011-08-06 ENCOUNTER — Encounter: Payer: Self-pay | Admitting: Family Medicine

## 2011-08-07 ENCOUNTER — Encounter (HOSPITAL_COMMUNITY): Payer: Self-pay

## 2011-08-07 ENCOUNTER — Telehealth: Payer: Self-pay | Admitting: *Deleted

## 2011-08-07 ENCOUNTER — Inpatient Hospital Stay (HOSPITAL_COMMUNITY)
Admission: AD | Admit: 2011-08-07 | Discharge: 2011-08-07 | Disposition: A | Discharge status: Home / Self Care | Payer: Medicaid Other | Source: Ambulatory Visit | Attending: Family Medicine | Admitting: Family Medicine

## 2011-08-07 DIAGNOSIS — R002 Palpitations: Secondary | ICD-10-CM

## 2011-08-07 DIAGNOSIS — O368190 Decreased fetal movements, unspecified trimester, not applicable or unspecified: Secondary | ICD-10-CM

## 2011-08-07 MED ORDER — LABETALOL HCL 200 MG PO TABS: 200.0000 mg | ORAL_TABLET | Freq: Two times a day (BID) | ORAL | Status: DC

## 2011-08-07 NOTE — Telephone Encounter (Signed)
I spoke with patient and explained that Dr. Shawnie Pons would like for her to start on labetalol 200 mg BID. Pt is agreeable to this. Her only concern is the cost. She says that she will do everything she can to get it. I explained that it is important for her to start on the medicine. She agrees. She will call us if she has any problems.

## 2011-08-07 NOTE — MAU Provider Note (Signed)
  History     CSN: [id number]  Arrival date and time: 08/07/11 1508   None     Chief Complaint  Patient presents with  . Decreased Fetal Movement   HPI This is a 26 year old G4 P0030 at 40 weeks and one-day who presents the MAU with concerns of decreased fetal activity. The patient felt a minimal fetal activity over the past 36 hours. Patient denies contractions, vaginal bleeding, vaginal discharge, headache, vision changes, abdominal pain. She does have palpitations in the form of PVCs as previously documented in outpatient record.  OB History    Grav Para Term Preterm Abortions TAB SAB Ect Mult Living   4 0 0 0 3 3 0 0 0 0       Past Medical History  Diagnosis Date  . Heart murmur     born with condition  . Chlamydia     Past Surgical History  Procedure Date  . Boil     facial boil i&d  . Dilation and curettage of uterus     abortion x3    Family History  Problem Relation Age of Onset  . Emphysema Mother   . Heart attack Father   . Heart disease Father   . Hypertension Father   . Cancer Maternal Grandmother   . Heart attack Paternal Grandmother   . Thyroid disease Paternal Grandmother   . Stroke Maternal Grandfather   . Hypertension Paternal Grandfather   . Anesthesia problems Neg Hx     History  Substance Use Topics  . Smoking status: Current Everyday Smoker -- 0.2 packs/day    Types: Cigarettes  . Smokeless tobacco: Not on file  . Alcohol Use: No    Allergies:  Allergies  Allergen Reactions  . Peanut-Containing Drug Products Hives and Swelling  . Penicillins Other (See Comments)    Childhood allergy; reaction unknown    Prescriptions prior to admission  Medication Sig Dispense Refill  . Prenatal Vit-Fe Fumarate-FA (PRENATAL MULTIVITAMIN) TABS Take 1 tablet by mouth every morning.      . labetalol (NORMODYNE) 200 MG tablet Take 1 tablet (200 mg total) by mouth 2 (two) times daily.  60 tablet  2    ROS Physical Exam   Blood pressure 95/58,  pulse 117, temperature 98.2 F (36.8 C), temperature source Axillary, resp. rate 20, last menstrual period 11/06/2010.  Physical Exam  Constitutional: She is oriented to person, place, and time. She appears well-developed and well-nourished.  GI: Soft. Bowel sounds are normal. She exhibits no distension and no mass. There is no tenderness. There is no rebound and no guarding.  Musculoskeletal: Normal range of motion.  Neurological: She is alert and oriented to person, place, and time.  Skin: Skin is warm and dry.  Psychiatric: She has a normal mood and affect. Her behavior is normal. Judgment and thought content normal.   Dilation: 1 Effacement (%): 50 Exam by:: Dr. Adrian Blackwater  NST category 1 tracing with baseline of 130s to 140s with multiple accelerations.  MAU Course  Procedures  MDM The patient was observed for approximately one hour in which the patient was given juice. She began to feel fetal activity.  Assessment and Plan  1 intrauterine pregnancy at 39 weeks 1 day #2 perceived decreased fetal activity  Patient had reactive NST. Patient's cervical exam unchanged. Patient sent home to followup with primary obstetrician to  Candelaria Celeste JEHIEL 08/07/2011, 4:20 PM

## 2011-08-07 NOTE — Discharge Instructions (Signed)
Fetal Movement Counts Patient Name: __________________________________________________ Patient Due Date: ____________________ Kick counts is highly recommended in high risk pregnancies, but it is a good idea for every pregnant woman to do. Start counting fetal movements at 28 weeks of the pregnancy. Fetal movements increase after eating a full meal or eating or drinking something sweet (the blood sugar is higher). It is also important to drink plenty of fluids (well hydrated) before doing the count. Lie on your left side because it helps with the circulation or you can sit in a comfortable chair with your arms over your belly (abdomen) with no distractions around you. DOING THE COUNT  Try to do the count the same time of day each time you do it.   Mark the day and time, then see how long it takes for you to feel 10 movements (kicks, flutters, swishes, rolls). You should have at least 10 movements within 2 hours. You will most likely feel 10 movements in much less than 2 hours. If you do not, wait an hour and count again. After a couple of days you will see a pattern.   What you are looking for is a change in the pattern or not enough counts in 2 hours. Is it taking longer in time to reach 10 movements?  SEEK MEDICAL CARE IF:  You feel less than 10 counts in 2 hours. Tried twice.   No movement in one hour.   The pattern is changing or taking longer each day to reach 10 counts in 2 hours.   You feel the baby is not moving as it usually does.  Date: ____________ Movements: ____________ Start time: ____________ Finish time: ____________  Date: ____________ Movements: ____________ Start time: ____________ Finish time: ____________ Date: ____________ Movements: ____________ Start time: ____________ Finish time: ____________ Date: ____________ Movements: ____________ Start time: ____________ Finish time: ____________ Date: ____________ Movements: ____________ Start time: ____________ Finish time:  ____________ Date: ____________ Movements: ____________ Start time: ____________ Finish time: ____________ Date: ____________ Movements: ____________ Start time: ____________ Finish time: ____________ Date: ____________ Movements: ____________ Start time: ____________ Finish time: ____________  Date: ____________ Movements: ____________ Start time: ____________ Finish time: ____________ Date: ____________ Movements: ____________ Start time: ____________ Finish time: ____________ Date: ____________ Movements: ____________ Start time: ____________ Finish time: ____________ Date: ____________ Movements: ____________ Start time: ____________ Finish time: ____________ Date: ____________ Movements: ____________ Start time: ____________ Finish time: ____________ Date: ____________ Movements: ____________ Start time: ____________ Finish time: ____________ Date: ____________ Movements: ____________ Start time: ____________ Finish time: ____________  Date: ____________ Movements: ____________ Start time: ____________ Finish time: ____________ Date: ____________ Movements: ____________ Start time: ____________ Finish time: ____________ Date: ____________ Movements: ____________ Start time: ____________ Finish time: ____________ Date: ____________ Movements: ____________ Start time: ____________ Finish time: ____________ Date: ____________ Movements: ____________ Start time: ____________ Finish time: ____________ Date: ____________ Movements: ____________ Start time: ____________ Finish time: ____________ Date: ____________ Movements: ____________ Start time: ____________ Finish time: ____________  Date: ____________ Movements: ____________ Start time: ____________ Finish time: ____________ Date: ____________ Movements: ____________ Start time: ____________ Finish time: ____________ Date: ____________ Movements: ____________ Start time: ____________ Finish time: ____________ Date: ____________ Movements:  ____________ Start time: ____________ Finish time: ____________ Date: ____________ Movements: ____________ Start time: ____________ Finish time: ____________ Date: ____________ Movements: ____________ Start time: ____________ Finish time: ____________ Date: ____________ Movements: ____________ Start time: ____________ Finish time: ____________  Date: ____________ Movements: ____________ Start time: ____________ Finish time: ____________ Date: ____________ Movements: ____________ Start time: ____________ Finish time: ____________ Date: ____________ Movements: ____________ Start time:   ____________ Finish time: ____________ Date: ____________ Movements: ____________ Start time: ____________ Finish time: ____________ Date: ____________ Movements: ____________ Start time: ____________ Finish time: ____________ Date: ____________ Movements: ____________ Start time: ____________ Finish time: ____________ Date: ____________ Movements: ____________ Start time: ____________ Finish time: ____________  Date: ____________ Movements: ____________ Start time: ____________ Finish time: ____________ Date: ____________ Movements: ____________ Start time: ____________ Finish time: ____________ Date: ____________ Movements: ____________ Start time: ____________ Finish time: ____________ Date: ____________ Movements: ____________ Start time: ____________ Finish time: ____________ Date: ____________ Movements: ____________ Start time: ____________ Finish time: ____________ Date: ____________ Movements: ____________ Start time: ____________ Finish time: ____________ Date: ____________ Movements: ____________ Start time: ____________ Finish time: ____________  Date: ____________ Movements: ____________ Start time: ____________ Finish time: ____________ Date: ____________ Movements: ____________ Start time: ____________ Finish time: ____________ Date: ____________ Movements: ____________ Start time: ____________ Finish  time: ____________ Date: ____________ Movements: ____________ Start time: ____________ Finish time: ____________ Date: ____________ Movements: ____________ Start time: ____________ Finish time: ____________ Date: ____________ Movements: ____________ Start time: ____________ Finish time: ____________ Date: ____________ Movements: ____________ Start time: ____________ Finish time: ____________  Date: ____________ Movements: ____________ Start time: ____________ Finish time: ____________ Date: ____________ Movements: ____________ Start time: ____________ Finish time: ____________ Date: ____________ Movements: ____________ Start time: ____________ Finish time: ____________ Date: ____________ Movements: ____________ Start time: ____________ Finish time: ____________ Date: ____________ Movements: ____________ Start time: ____________ Finish time: ____________ Date: ____________ Movements: ____________ Start time: ____________ Finish time: ____________ Document Released: 05/07/2006 Document Revised: 03/27/2011 Document Reviewed: 11/07/2008 [employer] Patient Information 2012 [employer]. 

## 2011-08-07 NOTE — Telephone Encounter (Signed)
Message copied by Mannie Stabile on Thu Aug 07, 2011  1:52 PM ------      Message from: Mayra Neer P      Created: Thu Aug 07, 2011  1:34 PM      Regarding: Please call patient       Please see below and call patient.  We will probably need to put the order in the computer to her pharmacy of choice.      THanks,      [employer]      ----- Message -----         From: Reva Bores, MD         Sent: 08/06/2011   4:36 PM           To: Emilio Aspen Rassette, RN      Subject: RE: Please advise                                        He advised me to start her on Labetalol and that would help her symptoms.  He does not think she will lose consciousness in labor as this has happened all her life.  Please see if she can start this and send 200 mg bid to pharmacy of her choice.      Thanks,      T      ----- Message -----         From: Juliette Mangle, RN         Sent: 08/05/2011   5:37 PM           To: Reva Bores, MD      Subject: Please advise                                            Hey,      Sending to you as you are the attending to see her last.  Please review Dr. Wilmon Arms note [facility]).  I spoke with him tonight.  He said the patient has a lot of PVCs.  15% of her beats are PVCs.  No heart muscle weakness.  He said the patient told him that her heart beats go crazy with stress - even as little as going up and down stairs.  His concern with vaginal delivery is that the stress will cause her to put the beats together causing her to go into v-tach.  He said if that happens he was concerned for the anxiety caused that it might result in a crash c-section.  He said his note pretty much says all this (though I have not looked at it).  If you would like to discuss with him, his cell # [phone].  Please advise how you would like me to proceed.  Patient is scheduled for Monday 08/11/11 with [facility].  I can move to attending if needed.      Thanks,      [employer]

## 2011-08-07 NOTE — MAU Note (Signed)
No movement in 2 days.  No other complaints, just not moving.

## 2011-08-08 ENCOUNTER — Telehealth: Payer: Self-pay | Admitting: *Deleted

## 2011-08-08 NOTE — Telephone Encounter (Signed)
Patient called and left a message on nurse voice mail today at 10:06 stating she is calling about a heart medicine her cardiologist prescribed her. Called patient and left a message we are returning her call and that office is now closed until Monday morning. We will call back Monday- if this cannnot wait until Monday and is urgent please call hospital operator and ask for doctor .

## 2011-08-11 ENCOUNTER — Other Ambulatory Visit (HOSPITAL_COMMUNITY): Payer: Self-pay

## 2011-08-11 ENCOUNTER — Ambulatory Visit (INDEPENDENT_AMBULATORY_CARE_PROVIDER_SITE_OTHER): Payer: Self-pay | Admitting: Family

## 2011-08-11 VITALS — BP 123/70 | Temp 98.8°F | Wt 234.8 lb

## 2011-08-11 DIAGNOSIS — D696 Thrombocytopenia, unspecified: Secondary | ICD-10-CM

## 2011-08-11 DIAGNOSIS — O99419 Diseases of the circulatory system complicating pregnancy, unspecified trimester: Secondary | ICD-10-CM

## 2011-08-11 DIAGNOSIS — Z3400 Encounter for supervision of normal first pregnancy, unspecified trimester: Secondary | ICD-10-CM

## 2011-08-11 LAB — POCT URINALYSIS DIP (DEVICE)
Bilirubin Urine: NEGATIVE
Glucose, UA: NEGATIVE mg/dL
Hgb urine dipstick: NEGATIVE
Nitrite: NEGATIVE
Specific Gravity, Urine: 1.025 (ref 1.005–1.030)
Urobilinogen, UA: 1 mg/dL (ref 0.0–1.0)

## 2011-08-11 MED ORDER — ATENOLOL 50 MG PO TABS: 50.0000 mg | ORAL_TABLET | Freq: Every day | ORAL | Status: DC

## 2011-08-11 NOTE — Progress Notes (Signed)
Pt unable to get labetalol>atenolol 50 mg qd (per pratt); reviewed signs of labor and GBS results.

## 2011-08-11 NOTE — Progress Notes (Signed)
Pulse 70 Pt unable to afford labetalol. Not sure what she should do.

## 2011-08-11 NOTE — Telephone Encounter (Signed)
Patient was seen in office today and discussed issue with provider and a different prescription written

## 2011-08-12 ENCOUNTER — Telehealth: Payer: Self-pay | Admitting: Internal Medicine

## 2011-08-12 NOTE — Telephone Encounter (Signed)
Pt 39 weeks and 6 days pregnant, was given atenolol for her pvc's, felt dizzy and drowsy after taking it, looked up med on line and saw it was for high bp and she usually had low bp and worried it will effect her bp, stopped taking it and wants to know if it can be changed?

## 2011-08-12 NOTE — Telephone Encounter (Signed)
Reviewed with Dr. Graciela Husbands. He recommends stopping atenolol. I have reviewed this with the patients. She states her palpitations are not bad right now. She will notify her doctor when she goes in for delivery. She is also aware to let the OB know Dr. Graciela Husbands is available for consult if needed.

## 2011-08-12 NOTE — Telephone Encounter (Signed)
Will forward to Dr. Graciela Husbands and Herbert Seta since they are in the office today.

## 2011-08-13 ENCOUNTER — Encounter (HOSPITAL_COMMUNITY): Payer: Self-pay | Admitting: *Deleted

## 2011-08-13 ENCOUNTER — Inpatient Hospital Stay (HOSPITAL_COMMUNITY)
Admission: AD | Admit: 2011-08-13 | Discharge: 2011-08-13 | Disposition: A | Discharge status: Home / Self Care | Payer: Medicaid Other | Source: Ambulatory Visit | Attending: Obstetrics and Gynecology | Admitting: Obstetrics and Gynecology

## 2011-08-13 DIAGNOSIS — O99891 Other specified diseases and conditions complicating pregnancy: Secondary | ICD-10-CM | POA: Insufficient documentation

## 2011-08-13 DIAGNOSIS — O479 False labor, unspecified: Secondary | ICD-10-CM

## 2011-08-13 HISTORY — DX: Cardiac arrhythmia, unspecified: I49.9

## 2011-08-13 NOTE — MAU Note (Signed)
Pt states U/C's started today at 2045 and are irregular ranging 1-25 minutes apart.  Pt states she passed mucous substance yesterday and today her pants became wet on way to hospital.  No large gush of fluid from vagina and no vaginal bleeding.

## 2011-08-13 NOTE — Discharge Instructions (Signed)
Normal Labor and Delivery Your caregiver must first be sure you are in labor. Signs of labor include:  You may pass what is called "the mucus plug" before labor begins. This is a small amount of blood stained mucus.   Regular uterine contractions.   The time between contractions get closer together.   The discomfort and pain gradually gets more intense.   Pains are mostly located in the back.   Pains get worse when walking.   The cervix (the opening of the uterus becomes thinner (begins to efface) and opens up (dilates).  Once you are in labor and admitted into the hospital or care center, your caregiver will do the following:  A complete physical examination.   Check your vital signs (blood pressure, pulse, temperature and the fetal heart rate).   Do a vaginal examination (using a sterile glove and lubricant) to determine:   The position (presentation) of the baby (head [vertex] or buttock first).   The level (station) of the baby's head in the birth canal.   The effacement and dilatation of the cervix.   You may have your pubic hair shaved and be given an enema depending on your caregiver and the circumstance.   An electronic monitor is usually placed on your abdomen. The monitor follows the length and intensity of the contractions, as well as the baby's heart rate.   Usually, your caregiver will insert an IV in your arm with a bottle of sugar water. This is done as a precaution so that medications can be given to you quickly during labor or delivery.  NORMAL LABOR AND DELIVERY IS DIVIDED UP INTO 3 STAGES: First Stage This is when regular contractions begin and the cervix begins to efface and dilate. This stage can last from 3 to 15 hours. The end of the first stage is when the cervix is 100% effaced and 10 centimeters dilated. Pain medications may be given by   Injection (morphine, demerol, etc.)   Regional anesthesia (spinal, caudal or epidural, anesthetics given in  different locations of the spine). Paracervical pain medication may be given, which is an injection of and anesthetic on each side of the cervix.  A pregnant woman may request to have "Natural Childbirth" which is not to have any medications or anesthesia during her labor and delivery. Second Stage This is when the baby comes down through the birth canal (vagina) and is born. This can take 1 to 4 hours. As the baby's head comes down through the birth canal, you may feel like you are going to have a bowel movement. You will get the urge to bear down and push until the baby is delivered. As the baby's head is being delivered, the caregiver will decide if an episiotomy (a cut in the perineum and vagina area) is needed to prevent tearing of the tissue in this area. The episiotomy is sewn up after the delivery of the baby and placenta. Sometimes a mask with nitrous oxide is given for the mother to breath during the delivery of the baby to help if there is too much pain. The end of Stage 2 is when the baby is fully delivered. Then when the umbilical cord stops pulsating it is clamped and cut. Third Stage The third stage begins after the baby is completely delivered and ends after the placenta (afterbirth) is delivered. This usually takes 5 to 30 minutes. After the placenta is delivered, a medication is given either by intravenous or injection to help contract   the uterus and prevent bleeding. The third stage is not painful and pain medication is usually not necessary. If an episiotomy was done, it is repaired at this time. After the delivery, the mother is watched and monitored closely for 1 to 2 hours to make sure there is no postpartum bleeding (hemorrhage). If there is a lot of bleeding, medication is given to contract the uterus and stop the bleeding. Document Released: 01/15/2008 Document Revised: 03/27/2011 Document Reviewed: 01/15/2008 [employer] Patient Information 2012 [employer]. 

## 2011-08-13 NOTE — MAU Provider Note (Signed)
Chief Complaint:  Labor Eval   First Provider Initiated Contact with Patient 08/13/11 2226      HPI  [name] is  26 y.o. G4P0030 at [redacted]w[redacted]d presents with LOF.  She reports passing her mucous plug on Monday, and seeing pinkish mucous when wiping today.  She had one episode of seeing fluid leaking while sitting on the toilet after urinating.  She reports good fetal movement, denies LOF, vaginal bleeding, vaginal itching/burning, urinary symptoms, h/a, dizziness, n/v, or fever/chills.   Pregnancy Course: uncomplicated  Past Medical History: Past Medical History  Diagnosis Date  . Heart murmur     born with condition  . Chlamydia   . Dysrhythmia     Past Surgical History: Past Surgical History  Procedure Date  . Boil     facial boil i&d  . Dilation and curettage of uterus     abortion x3    Family History: Family History  Problem Relation Age of Onset  . Emphysema Mother   . Heart attack Father   . Heart disease Father   . Hypertension Father   . Cancer Maternal Grandmother   . Heart attack Paternal Grandmother   . Thyroid disease Paternal Grandmother   . Stroke Maternal Grandfather   . Hypertension Paternal Grandfather   . Anesthesia problems Neg Hx     Social History: History  Substance Use Topics  . Smoking status: Current Everyday Smoker -- 0.2 packs/day    Types: Cigarettes  . Smokeless tobacco: Never Used  . Alcohol Use: No    Allergies:  Allergies  Allergen Reactions  . Peanut-Containing Drug Products Hives and Swelling  . Penicillins Other (See Comments)    Childhood allergy; reaction unknown    Meds:  Prescriptions prior to admission  Medication Sig Dispense Refill  . Prenatal Vit-Fe Fumarate-FA (PRENATAL MULTIVITAMIN) TABS Take 1 tablet by mouth every morning.          Physical Exam  Blood pressure 126/79, pulse 102, temperature 97.9 F (36.6 C), temperature source Oral, resp. rate 20, height 5\' 5"  (1.651 m), weight 107.049 kg (236  lb), last menstrual period 11/06/2010, SpO2 100.00%, unknown if currently breastfeeding. GENERAL: Well-developed, well-nourished female in no acute distress.  HEENT: normocephalic, good dentition HEART: normal rate RESP: normal effort ABDOMEN: Soft, nontender, nondistended, gravid.  EXTREMITIES: Nontender, no edema NEURO: alert and oriented  SPECULUM EXAM:  Cervix visually closed, no pooling after pt cough/bear down, scant white discharge  Dilation: 1 Effacement (%): 50 Cervical Position: Posterior Station: -2 Presentation: Vertex Exam by:: L. Paschal, RN  FHT:  Baseline 120 , moderate variability, accelerations present, no decelerations Contractions: 1 in 30 minutes   Labs: Ferning negative  Imaging:  Not indicated  Assessment: Intact membranes  Plan: D/C home with labor precautions Follow up with scheduled prenatal visits Return to MAU as needed   [name], [name] 4/24/201310:39 PM

## 2011-08-14 NOTE — MAU Provider Note (Signed)
Attestation of Attending Supervision of Advanced Practitioner: Evaluation and management procedures were performed by the PA/NP/CNM/OB Fellow under my supervision/collaboration. Chart reviewed and agree with management and plan.  Shakeda Pearse V 08/14/2011 6:48 AM

## 2011-08-15 ENCOUNTER — Encounter: Payer: Self-pay | Admitting: *Deleted

## 2011-08-15 DIAGNOSIS — O99419 Diseases of the circulatory system complicating pregnancy, unspecified trimester: Secondary | ICD-10-CM

## 2011-08-15 DIAGNOSIS — I2510 Atherosclerotic heart disease of native coronary artery without angina pectoris: Secondary | ICD-10-CM | POA: Insufficient documentation

## 2011-08-18 ENCOUNTER — Telehealth (HOSPITAL_COMMUNITY): Payer: Self-pay | Admitting: *Deleted

## 2011-08-18 ENCOUNTER — Encounter: Payer: Self-pay | Admitting: Family Medicine

## 2011-08-18 ENCOUNTER — Ambulatory Visit (INDEPENDENT_AMBULATORY_CARE_PROVIDER_SITE_OTHER): Payer: Self-pay | Admitting: Family Medicine

## 2011-08-18 ENCOUNTER — Encounter (HOSPITAL_COMMUNITY): Payer: Self-pay | Admitting: *Deleted

## 2011-08-18 DIAGNOSIS — O2390 Unspecified genitourinary tract infection in pregnancy, unspecified trimester: Secondary | ICD-10-CM

## 2011-08-18 DIAGNOSIS — O99330 Smoking (tobacco) complicating pregnancy, unspecified trimester: Secondary | ICD-10-CM

## 2011-08-18 DIAGNOSIS — O99419 Diseases of the circulatory system complicating pregnancy, unspecified trimester: Secondary | ICD-10-CM

## 2011-08-18 DIAGNOSIS — D696 Thrombocytopenia, unspecified: Secondary | ICD-10-CM

## 2011-08-18 DIAGNOSIS — I2510 Atherosclerotic heart disease of native coronary artery without angina pectoris: Secondary | ICD-10-CM

## 2011-08-18 LAB — POCT URINALYSIS DIP (DEVICE)
Nitrite: NEGATIVE
Protein, ur: 30 mg/dL — AB
Urobilinogen, UA: 1 mg/dL (ref 0.0–1.0)
pH: 6.5 (ref 5.0–8.0)

## 2011-08-18 NOTE — Progress Notes (Signed)
NST reviewed and reactive.  

## 2011-08-18 NOTE — Progress Notes (Signed)
Needs IOL NST today

## 2011-08-18 NOTE — Progress Notes (Signed)
IOL scheduled Aug 21, 2011 at 730 pm.

## 2011-08-18 NOTE — Progress Notes (Signed)
Edema-feet and hands. Pelvic pressure. Pulse 88.

## 2011-08-18 NOTE — Patient Instructions (Addendum)
Labor Induction  Most women go into labor on their own between 37 and 42 weeks of the pregnancy. When this does not happen or when there is a medical need, medicine or other methods may be used to induce labor. Labor induction causes a pregnant woman's uterus to contract. It also causes the cervix to soften (ripen), open (dilate), and thin out (efface). Usually, labor is not induced before 39 weeks of the pregnancy unless there is a problem with the baby or mother. Whether your labor will be induced depends on a number of factors, including the following:  The medical condition of you and the baby.   How many weeks along you are.   The status of baby's lung maturity.   The condition of the cervix.   The position of the baby.  REASONS FOR LABOR INDUCTION  The health of the baby or mother is at risk.   The pregnancy is overdue by 1 week or more.   The water breaks but labor does not start on its own.   The mother has a health condition or serious illness such as high blood pressure, infection, placental abruption, or diabetes.   The amniotic fluid amounts are low around the baby.   The baby is distressed.  REASONS TO NOT INDUCE LABOR Labor induction may not be a good idea if:  It is shown that your baby does not tolerate labor.   An induction is just more convenient.   You want the baby to be born on a certain date, like a holiday.   You have had previous surgeries on your uterus, such as a myomectomy or the removal of fibroids.   Your placenta lies very low in the uterus and blocks the opening of the cervix (placenta previa).   Your baby is not in a head down position.   The umbilical cord drops down into the birth canal in front of the baby. This could cut off the baby's blood and oxygen supply.   You have had a previous cesarean delivery.   There areunusual circumstances, such as the baby being extremely premature.  RISKS AND COMPLICATIONS Problems may occur in the  process of induction and plans may need to be modified as a situation unfolds. Some of the risks of induction include:  Change in fetal heart rate, such as too high, too low, or erratic.   Risk of fetal distress.   Risk of infection to mother and baby.   Increased chance of having a cesarean delivery.   The rare, but increased chance that the placenta will separate from the uterus (abruption).   Uterine rupture (very rare).  When induction is needed for medical reasons, the benefits of induction may outweigh the risks. BEFORE THE PROCEDURE Your caregiver will check your cervix and the baby's position. This will help your caregiver decide if you are far enough along for an induction to work. PROCEDURE Several methods of labor induction may be used, such as:   Taking prostaglandin medicine to dilate and ripen the cervix. The medicine will also start contractions. It can be taken by mouth or by inserting a suppository into the vagina.   A thin tube (catheter) with a balloon on the end may be inserted into your vagina to dilate the cervix. Once inserted, the balloon expands with water, which causes the cervix to open.   Striping the membranes. Your caregiver inserts a finger between the cervix and membranes, which causes the cervix to be stretched and   may cause the uterus to contract. This is often done during an office visit. You will be sent home to wait for the contractions to begin. You will then come in for an induction.   Breaking the water. Your caregiver will make a hole in the amniotic sac using a small instrument. Once the amniotic sac breaks, contractions should begin. This may still take hours to see an effect.   Taking medicine to trigger or strengthen contractions. This medicine is given intravenously through a tube in your arm.  All of the methods of induction, besides stripping the membranes, will be done in the hospital. Induction is done in the hospital so that you and the  baby can be carefully monitored. AFTER THE PROCEDURE Some inductions can take up to 2 or 3 days. Depending on the cervix, it usually takes less time. It takes longer when you are induced early in the pregnancy or if this is your first pregnancy. If a mother is still pregnant and the induction has been going on for 2 to 3 days, either the mother will be sent home or a cesarean delivery will be needed. Document Released: 08/27/2006 Document Revised: 03/27/2011 Document Reviewed: 02/10/2011 [facility] Patient Information 2012 [place], [place]. Contraception Choices Contraception (birth control) is the use of any methods or devices to prevent pregnancy. Below are some methods to help avoid pregnancy. HORMONAL METHODS   Contraceptive implant. This is a thin, plastic tube containing progesterone hormone. It does not contain estrogen hormone. Your caregiver inserts the tube in the inner part of the upper arm. The tube can remain in place for up to 3 years. After 3 years, the implant must be removed. The implant prevents the ovaries from releasing an egg (ovulation), thickens the cervical mucus which prevents sperm from entering the uterus, and thins the lining of the inside of the uterus.   Progesterone-only injections. These injections are given every 3 months by your caregiver to prevent pregnancy. This synthetic progesterone hormone stops the ovaries from releasing eggs. It also thickens cervical mucus and changes the uterine lining. This makes it harder for sperm to survive in the uterus.   Birth control pills. These pills contain estrogen and progesterone hormone. They work by stopping the egg from forming in the ovary (ovulation). Birth control pills are prescribed by a caregiver.Birth control pills can also be used to treat heavy periods.   Minipill. This type of birth control pill contains only the progesterone hormone. They are taken every day of each month and must be prescribed by your caregiver.     Birth control patch. The patch contains hormones similar to those in birth control pills. It must be changed once a week and is prescribed by a caregiver.   Vaginal ring. The ring contains hormones similar to those in birth control pills. It is left in the vagina for 3 weeks, removed for 1 week, and then a new one is put back in place. The patient must be comfortable inserting and removing the ring from the vagina.A caregiver's prescription is necessary.   Emergency contraception. Emergency contraceptives prevent pregnancy after unprotected sexual intercourse. This pill can be taken right after sex or up to 5 days after unprotected sex. It is most effective the sooner you take the pills after having sexual intercourse. Emergency contraceptive pills are available without a prescription. Check with your pharmacist. Do not use emergency contraception as your only form of birth control.  BARRIER METHODS   Female condom. This is a  thin sheath (latex or rubber) that is worn over the penis during sexual intercourse. It can be used with spermicide to increase effectiveness.   Female condom. This is a soft, loose-fitting sheath that is put into the vagina before sexual intercourse.   Diaphragm. This is a soft, latex, dome-shaped barrier that must be fitted by a caregiver. It is inserted into the vagina, along with a spermicidal jelly. It is inserted before intercourse. The diaphragm should be left in the vagina for 6 to 8 hours after intercourse.   Cervical cap. This is a round, soft, latex or plastic cup that fits over the cervix and must be fitted by a caregiver. The cap can be left in place for up to 48 hours after intercourse.   Sponge. This is a soft, circular piece of polyurethane foam. The sponge has spermicide in it. It is inserted into the vagina after wetting it and before sexual intercourse.   Spermicides. These are chemicals that kill or block sperm from entering the cervix and uterus. They  come in the form of creams, jellies, suppositories, foam, or tablets. They do not require a prescription. They are inserted into the vagina with an applicator before having sexual intercourse. The process must be repeated every time you have sexual intercourse.  INTRAUTERINE CONTRACEPTION  Intrauterine device (IUD). This is a T-shaped device that is put in a woman's uterus during a menstrual period to prevent pregnancy. There are 2 types:   Copper IUD. This type of IUD is wrapped in copper wire and is placed inside the uterus. Copper makes the uterus and fallopian tubes produce a fluid that kills sperm. It can stay in place for 10 years.   Hormone IUD. This type of IUD contains the hormone progestin (synthetic progesterone). The hormone thickens the cervical mucus and prevents sperm from entering the uterus, and it also thins the uterine lining to prevent implantation of a fertilized egg. The hormone can weaken or kill the sperm that get into the uterus. It can stay in place for 5 years.  PERMANENT METHODS OF CONTRACEPTION  Female tubal ligation. This is when the woman's fallopian tubes are surgically sealed, tied, or blocked to prevent the egg from traveling to the uterus.   Female sterilization. This is when the female has the tubes that carry sperm tied off (vasectomy).This blocks sperm from entering the vagina during sexual intercourse. After the procedure, the man can still ejaculate fluid (semen).  NATURAL PLANNING METHODS  Natural family planning. This is not having sexual intercourse or using a barrier method (condom, diaphragm, cervical cap) on days the woman could become pregnant.   Calendar method. This is keeping track of the length of each menstrual cycle and identifying when you are fertile.   Ovulation method. This is avoiding sexual intercourse during ovulation.   Symptothermal method. This is avoiding sexual intercourse during ovulation, using a thermometer and ovulation symptoms.     Post-ovulation method. This is timing sexual intercourse after you have ovulated.  Regardless of which type or method of contraception you choose, it is important that you use condoms to protect against the transmission of sexually transmitted diseases (STDs). Talk with your caregiver about which form of contraception is most appropriate for you. Document Released: 04/07/2005 Document Revised: 03/27/2011 Document Reviewed: 08/14/2010 [facility] Patient Information 2012 [place], [place]. Postpartum Depression and Baby Blues The postpartum period begins right after the birth of a baby. During this time, there is often a great amount of joy and excitement.  It is also a time of considerable changes in the life of the parent(s). Regardless of how many times a mother gives birth, each child brings new challenges and dynamics to the family. It is not unusual to have feelings of excitement accompanied by confusing shifts in moods, emotions, and thoughts. All mothers are at risk of developing postpartum depression or the "baby blues." These mood changes can occur right after giving birth, or they may occur many months after giving birth. The baby blues or postpartum depression can be mild or severe. Additionally, postpartum depression can resolve rather quickly, or it can be a long-term condition. CAUSES Elevated hormones and their rapid decline are thought to be a main cause of postpartum depression and the baby blues. There are a number of hormones that radically change during and after pregnancy. Estrogen and progesterone usually decrease immediately after delivering your baby. The level of thyroid hormone and various cortisol steroids also rapidly drop. Other factors that play a major role in these changes include major life events and genetics.  RISK FACTORS If you have any of the following risks for the baby blues or postpartum depression, know what symptoms to watch out for during the postpartum period. Risk  factors that may increase the likelihood of getting the baby blues or postpartum depression include:  Havinga personal or family history of depression.   Having depression while being pregnant.   Having premenstrual or oral contraceptive-associated mood issues.   Having exceptional life stress.   Having marital conflict.   Lacking a social support network.   Having a baby with special needs.   Having health problems such as diabetes.  SYMPTOMS Baby blues symptoms include:  Brief fluctuations in mood, such as going from extreme happiness to sadness.   Decreased concentration.   Difficulty sleeping.   Crying spells, tearfulness.   Irritability.   Anxiety.  Postpartum depression symptoms typically begin within the first month after giving birth. These symptoms include:  Difficulty sleeping or excessive sleepiness.   Marked weight loss.   Agitation.   Feelings of worthlessness.   Lack of interest in activity or food.  Postpartum psychosis is a very concerning condition and can be dangerous. Fortunately, it is rare. Displaying any of the following symptoms is cause for immediate medical attention. Postpartum psychosis symptoms include:  Hallucinations and delusions.   Bizarre or disorganized behavior.   Confusion or disorientation.  DIAGNOSIS  A diagnosis is made by an evaluation of your symptoms. There are no medical or lab tests that lead to a diagnosis, but there are various questionnaires that a caregiver may use to identify those with the baby blues, postpartum depression, or psychosis. Often times, a screening tool called the [place] Postnatal Depression Scale is used to diagnose depression in the postpartum period.  TREATMENT The baby blues usually goes away on its own in 1 to 2 weeks. Social support is often all that is needed. You should be encouraged to get adequate sleep and rest. Occasionally, you may be given medicines to help you sleep.  Postpartum  depression requires treatment as it can last several months or longer if it is not treated. Treatment may include individual or group therapy, medicine, or both to address any social, physiological, and psychological factors that may play a role in the depression. Regular exercise, a healthy diet, rest, and social support may also be strongly recommended.  Postpartum psychosis is more serious and needs treatment right away. Hospitalization is often needed. HOME CARE INSTRUCTIONS  Get as much rest as you can. Nap when the baby sleeps.   Exercise regularly. Some women find yoga and walking to be beneficial.   Eat a balanced and nourishing diet.   Do little things that you enjoy. Have a cup of tea, take a bubble bath, read your favorite magazine, or listen to your favorite music.   Avoid alcohol.   Ask for help with household chores, cooking, grocery shopping, or running errands as needed. Do not try to do everything.   Talk to people close to you about how you are feeling. Get support from your partner, family members, friends, or other new moms.   Try to stay positive in how you think. Think about the things you are grateful for.   Do not spend a lot of time alone.   Only take medicine as directed by your caregiver.   Keep all your postpartum appointments.   Let your caregiver know if you have any concerns.  SEEK MEDICAL CARE IF: You are having a reaction or problems with your medicine. SEEK IMMEDIATE MEDICAL CARE IF:  You have suicidal feelings.   You feel you may harm the baby or someone else.  Document Released: 01/10/2004 Document Revised: 03/27/2011 Document Reviewed: 02/11/2011 [facility] Patient Information 2012 [place], [place]. Breastfeeding BENEFITS OF BREASTFEEDING For the baby  The first milk (colostrum) helps the baby's digestive system function better.   There are antibodies from the mother in the milk that help the baby fight off infections.   The baby has a  lower incidence of asthma, allergies, and SIDS (sudden infant death syndrome).   The nutrients in breast milk are better than formulas for the baby and helps the baby's brain grow better.   Babies who breastfeed have less gas, colic, and constipation.  For the mother  Breastfeeding helps develop a very special bond between mother and baby.   It is more convenient, always available at the correct temperature and cheaper than formula feeding.   It burns calories in the mother and helps with losing weight that was gained during pregnancy.   It makes the uterus contract back down to normal size faster and slows bleeding following delivery.   Breastfeeding mothers have a lower risk of developing breast cancer.  NURSE FREQUENTLY  A healthy, full-term baby may breastfeed as often as every hour or space his or her feedings to every 3 hours.   How often to nurse will vary from baby to baby. Watch your baby for signs of hunger, not the clock.   Nurse as often as the baby requests, or when you feel the need to reduce the fullness of your breasts.   Awaken the baby if it has been 3 to 4 hours since the last feeding.   Frequent feeding will help the mother make more milk and will prevent problems like sore nipples and engorgement of the breasts.  BABY'S POSITION AT THE BREAST  Whether lying down or sitting, be sure that the baby's tummy is facing your tummy.   Support the breast with 4 fingers underneath the breast and the thumb above. Make sure your fingers are well away from the nipple and baby's mouth.   Stroke the baby's lips and cheek closest to the breast gently with your finger or nipple.   When the baby's mouth is open wide enough, place all of your nipple and as much of the dark area around the nipple as possible into your baby's mouth.   Pull the  baby in close so the tip of the nose and the baby's cheeks touch the breast during the feeding.  FEEDINGS  The length of each feeding  varies from baby to baby and from feeding to feeding.   The baby must suck about 2 to 3 minutes for your milk to get to him or her. This is called a "let down." For this reason, allow the baby to feed on each breast as long as he or she wants. Your baby will end the feeding when he or she has received the right balance of nutrients.   To break the suction, put your finger into the corner of the baby's mouth and slide it between his or her gums before removing your breast from his or her mouth. This will help prevent sore nipples.  REDUCING BREAST ENGORGEMENT  In the first week after your baby is born, you may experience signs of breast engorgement. When breasts are engorged, they feel heavy, warm, full, and may be tender to the touch. You can reduce engorgement if you:   Nurse frequently, every 2 to 3 hours. Mothers who breastfeed early and often have fewer problems with engorgement.   Place light ice packs on your breasts between feedings. This reduces swelling. Wrap the ice packs in a lightweight towel to protect your skin.   Apply moist hot packs to your breast for 5 to 10 minutes before each feeding. This increases circulation and helps the milk flow.   Gently massage your breast before and during the feeding.   Make sure that the baby empties at least one breast at every feeding before switching sides.   Use a breast pump to empty the breasts if your baby is sleepy or not nursing well. You may also want to pump if you are returning to work or or you feel you are getting engorged.   Avoid bottle feeds, pacifiers or supplemental feedings of water or juice in place of breastfeeding.   Be sure the baby is latched on and positioned properly while breastfeeding.   Prevent fatigue, stress, and anemia.   Wear a supportive bra, avoiding underwire styles.   Eat a balanced diet with enough fluids.  If you follow these suggestions, your engorgement should improve in 24 to 48 hours. If you are  still experiencing difficulty, call your lactation consultant or caregiver. IS MY BABY GETTING ENOUGH MILK? Sometimes, mothers worry about whether their babies are getting enough milk. You can be assured that your baby is getting enough milk if:  The baby is actively sucking and you hear swallowing.   The baby nurses at least 8 to 12 times in a 24 hour time period. Nurse your baby until he or she unlatches or falls asleep at the first breast (at least 10 to 20 minutes), then offer the second side.   The baby is wetting 5 to 6 disposable diapers (6 to 8 cloth diapers) in a 24 hour period by 47 to 56 days of age.   The baby is having at least 2 to 3 stools every 24 hours for the first few months. Breast milk is all the food your baby needs. It is not necessary for your baby to have water or formula. In fact, to help your breasts make more milk, it is best not to give your baby supplemental feedings during the early weeks.   The stool should be soft and yellow.   The baby should gain 4 to 7 ounces per week after  he is 8 days old.  TAKE CARE OF YOURSELF Take care of your breasts by:  Bathing or showering daily.   Avoiding the use of soaps on your nipples.   Start feedings on your left breast at one feeding and on your right breast at the next feeding.   You will notice an increase in your milk supply 2 to 5 days after delivery. You may feel some discomfort from engorgement, which makes your breasts very firm and often tender. Engorgement "peaks" out within 24 to 48 hours. In the meantime, apply warm moist towels to your breasts for 5 to 10 minutes before feeding. Gentle massage and expression of some milk before feeding will soften your breasts, making it easier for your baby to latch on. Wear a well fitting nursing bra and air dry your nipples for 10 to 15 minutes after each feeding.   Only use cotton bra pads.   Only use pure lanolin on your nipples after nursing. You do not need to wash it  off before nursing.  Take care of yourself by:   Eating well-balanced meals and nutritious snacks.   Drinking milk, fruit juice, and water to satisfy your thirst (about 8 glasses a day).   Getting plenty of rest.   Increasing calcium in your diet (1200 mg a day).   Avoiding foods that you notice affect the baby in a bad way.  SEEK MEDICAL CARE IF:   You have any questions or difficulty with breastfeeding.   You need help.   You have a hard, red, sore area on your breast, accompanied by a fever of 100.5 F (38.1 C) or more.   Your baby is too sleepy to eat well or is having trouble sleeping.   Your baby is wetting less than 6 diapers per day, by 65 days of age.   Your baby's skin or white part of his or her eyes is more yellow than it was in the hospital.   You feel depressed.  Document Released: 04/07/2005 Document Revised: 03/27/2011 Document Reviewed: 11/20/2008 [facility] Patient Information 2012 [place], [place].

## 2011-08-18 NOTE — Telephone Encounter (Signed)
Preadmission screen  

## 2011-08-20 ENCOUNTER — Encounter (HOSPITAL_COMMUNITY): Payer: Self-pay | Admitting: *Deleted

## 2011-08-20 ENCOUNTER — Inpatient Hospital Stay (HOSPITAL_COMMUNITY)
Admission: AD | Admit: 2011-08-20 | Discharge: 2011-08-20 | Disposition: A | Discharge status: Home / Self Care | Payer: Medicaid Other | Source: Ambulatory Visit | Attending: Obstetrics and Gynecology | Admitting: Obstetrics and Gynecology

## 2011-08-20 DIAGNOSIS — D696 Thrombocytopenia, unspecified: Secondary | ICD-10-CM | POA: Insufficient documentation

## 2011-08-20 DIAGNOSIS — O99419 Diseases of the circulatory system complicating pregnancy, unspecified trimester: Secondary | ICD-10-CM

## 2011-08-20 DIAGNOSIS — O479 False labor, unspecified: Secondary | ICD-10-CM | POA: Insufficient documentation

## 2011-08-20 DIAGNOSIS — I2510 Atherosclerotic heart disease of native coronary artery without angina pectoris: Secondary | ICD-10-CM | POA: Insufficient documentation

## 2011-08-20 DIAGNOSIS — D689 Coagulation defect, unspecified: Secondary | ICD-10-CM | POA: Insufficient documentation

## 2011-08-20 MED ORDER — ACETAMINOPHEN 325 MG PO TABS
650.0000 mg | ORAL_TABLET | ORAL | Status: AC
  Administered 2011-08-20: 650 mg via ORAL
  Filled 2011-08-20: qty 2

## 2011-08-20 NOTE — MAU Note (Cosign Needed)
Mwmbranes stripped Monday, was 1/70. Goes to [facility] for thrombocytopenia. Ctx's q4-6, bloody show, deneis lof.

## 2011-08-20 NOTE — Discharge Instructions (Signed)

## 2011-08-21 ENCOUNTER — Encounter (HOSPITAL_COMMUNITY): Payer: Self-pay | Admitting: Anesthesiology

## 2011-08-21 ENCOUNTER — Inpatient Hospital Stay (HOSPITAL_COMMUNITY)
Admission: RE | Admit: 2011-08-21 | Discharge: 2011-08-25 | DRG: 774 | Disposition: A | Discharge status: Home / Self Care | Payer: Medicaid Other | Source: Ambulatory Visit | Attending: Obstetrics & Gynecology | Admitting: Obstetrics & Gynecology

## 2011-08-21 ENCOUNTER — Encounter (HOSPITAL_COMMUNITY): Payer: Self-pay

## 2011-08-21 DIAGNOSIS — O480 Post-term pregnancy: Principal | ICD-10-CM | POA: Diagnosis present

## 2011-08-21 DIAGNOSIS — I2510 Atherosclerotic heart disease of native coronary artery without angina pectoris: Secondary | ICD-10-CM | POA: Diagnosis present

## 2011-08-21 DIAGNOSIS — I4949 Other premature depolarization: Secondary | ICD-10-CM | POA: Diagnosis present

## 2011-08-21 LAB — CBC
MCH: 30.6 pg (ref 26.0–34.0)
MCV: 88.5 fL (ref 78.0–100.0)
Platelets: 126 10*3/uL — ABNORMAL LOW (ref 150–400)
RBC: 4.35 MIL/uL (ref 3.87–5.11)

## 2011-08-21 MED ORDER — IBUPROFEN 600 MG PO TABS
600.0000 mg | ORAL_TABLET | Freq: Four times a day (QID) | ORAL | Status: DC | PRN
  Administered 2011-08-23: 600 mg via ORAL
  Filled 2011-08-21: qty 1

## 2011-08-21 MED ORDER — OXYTOCIN 20 UNITS IN LACTATED RINGERS INFUSION - SIMPLE: 125.0000 mL/h | Freq: Once | INTRAVENOUS | Status: DC

## 2011-08-21 MED ORDER — CITRIC ACID-SODIUM CITRATE 334-500 MG/5ML PO SOLN: 30.0000 mL | ORAL | Status: DC | PRN

## 2011-08-21 MED ORDER — OXYTOCIN BOLUS FROM INFUSION
500.0000 mL | Freq: Once | INTRAVENOUS | Status: DC
  Filled 2011-08-21: qty 1000
  Filled 2011-08-21: qty 500

## 2011-08-21 MED ORDER — LACTATED RINGERS IV SOLN
INTRAVENOUS | Status: DC
  Administered 2011-08-22 – 2011-08-23 (×4): via INTRAVENOUS

## 2011-08-21 MED ORDER — NALBUPHINE SYRINGE 5 MG/0.5 ML
5.0000 mg | INJECTION | INTRAMUSCULAR | Status: DC | PRN
  Administered 2011-08-21 – 2011-08-22 (×3): 5 mg via INTRAVENOUS
  Filled 2011-08-21 (×3): qty 0.5

## 2011-08-21 MED ORDER — LIDOCAINE HCL (PF) 1 % IJ SOLN
30.0000 mL | INTRAMUSCULAR | Status: DC | PRN
  Filled 2011-08-21: qty 30

## 2011-08-21 MED ORDER — ACETAMINOPHEN 325 MG PO TABS
650.0000 mg | ORAL_TABLET | ORAL | Status: DC | PRN
  Administered 2011-08-22: 650 mg via ORAL
  Filled 2011-08-21: qty 1

## 2011-08-21 MED ORDER — ONDANSETRON HCL 4 MG/2ML IJ SOLN
4.0000 mg | Freq: Four times a day (QID) | INTRAMUSCULAR | Status: DC | PRN
  Administered 2011-08-22: 4 mg via INTRAVENOUS
  Filled 2011-08-21: qty 2

## 2011-08-21 MED ORDER — LACTATED RINGERS IV SOLN: 500.0000 mL | INTRAVENOUS | Status: DC | PRN

## 2011-08-21 MED ORDER — FLEET ENEMA 7-19 GM/118ML RE ENEM: 1.0000 | ENEMA | RECTAL | Status: DC | PRN

## 2011-08-21 MED ORDER — OXYCODONE-ACETAMINOPHEN 5-325 MG PO TABS: 1.0000 | ORAL_TABLET | ORAL | Status: DC | PRN

## 2011-08-21 NOTE — H&P (Signed)
NAVPREET SZCZYGIEL is a 26 y.o. female presenting for IOL due to postdate w/ a h/o significant PVC's (Exercise intolerance adn stress induced tachycardia and palpitations). Significant cardiac workup during pregnancy at [facility]. Periods of low plts during pregnancy to the 110s. Most recently at 126.  No complications reported during pregnancy. Regular contractions w/ increasing frequency since having membranes stripped on Tuesday. Minimal vaginal spotting since Tuesday.    Maternal Medical History:  Reason for admission: Reason for Admission:   nauseaInduction due to post dates  Contractions: Onset was 2 days ago.   Frequency: regular.   Perceived severity is moderate.    Fetal activity: Perceived fetal activity is normal.   Last perceived fetal movement was within the past hour.    Prenatal Complications - Diabetes: none.    OB History    Grav Para Term Preterm Abortions TAB SAB Ect Mult Living   4 0   3     0     Past Medical History  Diagnosis Date          . Chlamydia   . Dysrhythmia   . Thrombocytopenia    Past Surgical History  Procedure Date  . Boil     facial boil i&d  . Dilation and curettage of uterus     abortion x3   Family History: family history includes Cancer in her maternal grandmother; Emphysema in her mother; Heart attack in her father and paternal grandmother; Heart disease in her father; Hypertension in her father and paternal grandfather; and Thyroid disease in her paternal grandmother.  There is no history of Anesthesia problems. Social History:  reports that she has been smoking Cigarettes.  She has a 2.25 pack-year smoking history. She has never used smokeless tobacco. 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 She reports that she does not drink alcohol.  Review of Systems  Constitutional: Negative for fever.  Eyes: Negative for blurred vision and double vision.  Respiratory: Negative for shortness of breath.     Cardiovascular: Negative for chest pain.  Gastrointestinal: Positive for constipation. Negative for heartburn, nausea, vomiting, abdominal pain, diarrhea and blood in stool.  Genitourinary: Negative for dysuria, urgency, frequency and hematuria.  Skin: Negative for rash.  Neurological: Negative for dizziness, tingling, sensory change and headaches.      Blood pressure 105/71, pulse 97, temperature 98.1 F (36.7 C), temperature source Oral, resp. rate 18, height 5\' 6"  (1.676 m), weight 106.142 kg (234 lb), last menstrual period 11/06/2010, SpO2 96.00%. Maternal Exam:  Uterine Assessment: Contraction strength is moderate.  Contraction frequency is regular.   Abdomen: Fundal height is Term.   Fetal presentation: vertex  Introitus: Normal vulva. Normal vagina.  Vagina is negative for discharge.  Ferning test: not done.  Nitrazine test: not done.  Pelvis: adequate for delivery.   Cervix: Cervix evaluated by digital exam.     Physical Exam  Constitutional: She is oriented to person, place, and time. She appears well-developed and well-nourished.  Eyes: EOM are normal.  Neck: Normal range of motion.  Cardiovascular: Normal rate, regular rhythm and normal heart sounds.   Respiratory: Effort normal and breath sounds normal. No respiratory distress.  GI: Soft. There is no tenderness. There is no rebound and no guarding.  Genitourinary: Vagina normal and uterus normal. No vaginal discharge found.  Musculoskeletal: Normal range of motion.  Neurological: She is alert and oriented to person, place, and time.  Skin: Skin is warm and dry. No rash noted.  No erythema.  Psychiatric: She has a normal mood and affect. Her behavior is normal. Judgment and thought content normal.    Prenatal labs: ABO, Rh: O/Negative/-- (10/25 0000) Antibody: Negative (10/25 0000) Rubella: Immune (10/25 0000) RPR:   Neg HBsAg:   Neg HIV: Non-reactive (01/31 0000)  GBS: Negative (03/31 0000)    Assessment/Plan: 25yo [redacted]w[redacted]d G4P0030 here for IOL and a significant cardiac hx as outlined in HPI.  - Admit to L&D - UDS as pt w/ previous + for cocaine - Anesthesia aware of pt - Foley bulb - Cardiology and other providers to evaluate in am.  Will again discuss transfer at that time as pt amenable.   Ananth Fiallos 08/21/2011, 9:44 PM

## 2011-08-21 NOTE — Progress Notes (Addendum)
Patient ID: [name], female   DOB: 1985-10-16, 26 y.o.   MRN: [id number] Case discussed with anesthesiologist on call. Due to the patient's cardiac history, it was felt that the patient would benefit from delivering in a facility where a cardiologist would be immediately available if needed. Discussed with patients benefits of transfer to [facility] prior to the onset of induction and active labor. Patient declined as her family would not have transportation to that facility. Patient wishes to proceed with induction of labor. Will initiate induction of labor with cervical foley. Anesthesiologist on call was made aware of patient's decision.

## 2011-08-21 NOTE — Anesthesia Preprocedure Evaluation (Deleted)
Anesthesia Evaluation  Patient identified by MRN, date of birth, ID band Patient awake    Reviewed: Allergy & Precautions, H&P , Patient's Chart, lab work & pertinent test results, reviewed documented beta blocker date and time   Airway Mallampati: III TM Distance: >3 FB Neck ROM: full    Dental No notable dental hx.    Pulmonary neg pulmonary ROS,  breath sounds clear to auscultation  Pulmonary exam normal       Cardiovascular Exercise Tolerance: Poor + dysrhythmias + Valvular Problems/Murmurs Rhythm:irregular Rate:Normal     Neuro/Psych negative neurological ROS  negative psych ROS   GI/Hepatic negative GI ROS, Neg liver ROS,   Endo/Other  negative endocrine ROS  Renal/GU negative Renal ROS     Musculoskeletal   Abdominal   Peds  Hematology negative hematology ROS (+)   Anesthesia Other Findings Echo shows no valvular anomalies 15% of beats are PVCs- risk of sustained VTACH with fluid shifts on delivery or during labor  Reproductive/Obstetrics negative OB ROS (+) Pregnancy                        Anesthesia Physical Anesthesia Plan  ASA: III  Anesthesia Plan: General ETT   Post-op Pain Management:    Induction:   Airway Management Planned:   Additional Equipment:   Intra-op Plan:   Post-operative Plan:   Informed Consent: I have reviewed the patients History and Physical, chart, labs and discussed the procedure including the risks, benefits and alternatives for the proposed anesthesia with the patient or authorized representative who has indicated his/her understanding and acceptance.   Dental Advisory Given  Plan Discussed with: CRNA and Surgeon  Anesthesia Plan Comments:        Discussed at length risk of sustained VTACH with myocardial strain from pregnancy and vaginal delivery.  Discussed delivering at a hospital with better cardiology/ICU support in the same building  as OB.  Discussed invasive lines and patient currently will not agree to invasive lines.  Patient is adament that she deliver at this hospital and plans to continue with elective induction.  I asked to her to consider a-line if she chooses to stay here for delivery, but she refused.  We will put her on telemetry and have stat bedside C/S kit available.  Her and her husbands questions were answered.   Anesthesia Quick Evaluation

## 2011-08-22 ENCOUNTER — Encounter (HOSPITAL_COMMUNITY): Payer: Self-pay | Admitting: Anesthesiology

## 2011-08-22 ENCOUNTER — Inpatient Hospital Stay (HOSPITAL_COMMUNITY): Payer: Medicaid Other | Admitting: Anesthesiology

## 2011-08-22 LAB — RAPID URINE DRUG SCREEN, HOSP PERFORMED
Barbiturates: NOT DETECTED
Benzodiazepines: NOT DETECTED

## 2011-08-22 MED ORDER — DIPHENHYDRAMINE HCL 50 MG/ML IJ SOLN: 12.5000 mg | INTRAMUSCULAR | Status: DC | PRN

## 2011-08-22 MED ORDER — EPHEDRINE 5 MG/ML INJ: 10.0000 mg | INTRAVENOUS | Status: DC | PRN

## 2011-08-22 MED ORDER — PHENYLEPHRINE 40 MCG/ML (10ML) SYRINGE FOR IV PUSH (FOR BLOOD PRESSURE SUPPORT)
80.0000 ug | PREFILLED_SYRINGE | INTRAVENOUS | Status: DC | PRN
  Filled 2011-08-22: qty 5

## 2011-08-22 MED ORDER — TERBUTALINE SULFATE 1 MG/ML IJ SOLN: 0.2500 mg | Freq: Once | INTRAMUSCULAR | Status: AC | PRN

## 2011-08-22 MED ORDER — LIDOCAINE HCL (PF) 1 % IJ SOLN
INTRAMUSCULAR | Status: DC | PRN
  Administered 2011-08-22 (×2): 4 mL

## 2011-08-22 MED ORDER — FENTANYL 2.5 MCG/ML BUPIVACAINE 1/10 % EPIDURAL INFUSION (WH - ANES)
14.0000 mL/h | INTRAMUSCULAR | Status: DC
  Administered 2011-08-22 – 2011-08-23 (×6): 14 mL/h via EPIDURAL
  Filled 2011-08-22 (×9): qty 60

## 2011-08-22 MED ORDER — EPHEDRINE 5 MG/ML INJ
10.0000 mg | INTRAVENOUS | Status: DC | PRN
  Filled 2011-08-22: qty 4

## 2011-08-22 MED ORDER — OXYTOCIN 20 UNITS IN LACTATED RINGERS INFUSION - SIMPLE
1.0000 m[IU]/min | INTRAVENOUS | Status: DC
  Administered 2011-08-22: 2 m[IU]/min via INTRAVENOUS
  Administered 2011-08-23: 333 m[IU]/min via INTRAVENOUS
  Filled 2011-08-22: qty 1000

## 2011-08-22 MED ORDER — FENTANYL 2.5 MCG/ML BUPIVACAINE 1/10 % EPIDURAL INFUSION (WH - ANES)
INTRAMUSCULAR | Status: DC | PRN
  Administered 2011-08-22: 14 mL/h via EPIDURAL
  Administered 2011-08-23 (×2)

## 2011-08-22 MED ORDER — ZOLPIDEM TARTRATE 10 MG PO TABS
10.0000 mg | ORAL_TABLET | Freq: Once | ORAL | Status: AC
  Administered 2011-08-22: 10 mg via ORAL
  Filled 2011-08-22: qty 1

## 2011-08-22 MED ORDER — LACTATED RINGERS IV SOLN
500.0000 mL | Freq: Once | INTRAVENOUS | Status: AC
  Administered 2011-08-22: 500 mL via INTRAVENOUS

## 2011-08-22 MED ORDER — PHENYLEPHRINE 40 MCG/ML (10ML) SYRINGE FOR IV PUSH (FOR BLOOD PRESSURE SUPPORT): 80.0000 ug | PREFILLED_SYRINGE | INTRAVENOUS | Status: DC | PRN

## 2011-08-22 NOTE — Anesthesia Preprocedure Evaluation (Signed)
Anesthesia Evaluation  Patient identified by MRN, date of birth, ID band Patient awake    Reviewed: Allergy & Precautions, H&P , Patient's Chart, lab work & pertinent test results  Airway Mallampati: II TM Distance: >3 FB Neck ROM: full    Dental No notable dental hx. (+) Teeth Intact   Pulmonary Current Smoker,  9 Pack years breath sounds clear to auscultation  Pulmonary exam normal       Cardiovascular negative cardio ROS  + dysrhythmias Supra Ventricular Tachycardia + Valvular Problems/Murmurs MR Rhythm:regular Rate:Normal     Neuro/Psych negative neurological ROS  negative psych ROS   GI/Hepatic negative GI ROS, Neg liver ROS,   Endo/Other  negative endocrine ROSMorbid obesity  Renal/GU negative Renal ROS  negative genitourinary   Musculoskeletal negative musculoskeletal ROS (+)   Abdominal Normal abdominal exam  (+)   Peds  Hematology negative hematology ROS (+)   Anesthesia Other Findings       Reproductive/Obstetrics negative OB ROS (+) Pregnancy                           Anesthesia Physical Anesthesia Plan  ASA: III  Anesthesia Plan: Epidural   Post-op Pain Management:    Induction:   Airway Management Planned:   Additional Equipment:   Intra-op Plan:   Post-operative Plan:   Informed Consent: I have reviewed the patients History and Physical, chart, labs and discussed the procedure including the risks, benefits and alternatives for the proposed anesthesia with the patient or authorized representative who has indicated his/her understanding and acceptance.   Dental Advisory Given  Plan Discussed with: Anesthesiologist, Surgeon and CRNA  Anesthesia Plan Comments:         Anesthesia Quick Evaluation

## 2011-08-22 NOTE — Progress Notes (Signed)
Patient ID: [name], female   DOB: Sep 17, 1985, 26 y.o.   MRN: [id number] SROM -> clear at 1245. FHR stable. Cx same per RN exam  Plan: epidural on request

## 2011-08-22 NOTE — Progress Notes (Addendum)
Patient ID: [name], female   DOB: 1985-09-07, 26 y.o.   MRN: [id number] [name] is a 26 y.o. G4P0030 at [redacted]w[redacted]d admitted for IOL indicated by postdates  Subjective: Comfortable. Foley bulb came out @0500 . Anxious and wants epidural as soon as UCs are painful. Asymptomatic for PVCs.   Objective: BP 114/68  Pulse 77  Temp(Src) 97.9 F (36.6 C) (Oral)  Resp 20  Ht 5\' 6"  (1.676 m)  Wt 106.142 kg (234 lb)  BMI 37.77 kg/m2  SpO2 99%  LMP 11/06/2010  Fetal Heart FHR: 140 bpm, variability: moderate,  accelerations:  Present,  decelerations:  Absent   Contractions: q -11 min  SVE:   Dilation: 4 Effacement (%): 90 Station: -2 Exam by:: D. Jasen Hartstein CNM  Assessment / Plan: Maternal cardiac arrythmia Labor: early/active with inadequate UCs Fetal Wellbeing: Category 1 FHR Pain Control:  n/a Expected mode of delivery: NSVD  Will ambulate off telemetry and EFM x 1 hr, then start pitocin augmentation  Damel Querry 08/22/2011, 11:04 AM

## 2011-08-22 NOTE — Progress Notes (Signed)
Patient ID: [name], female   DOB: 1986-02-26, 26 y.o.   MRN: [id number] SANDY HAYE is a 26 y.o. G4P0030 at [redacted]w[redacted]d  Subjective: Comfortable.  Objective: BP 127/90  Pulse 86  Temp(Src) 98.2 F (36.8 C) (Axillary)  Resp 20  Ht 5\' 6"  (1.676 m)  Wt 106.142 kg (234 lb)  BMI 37.77 kg/m2  SpO2 99%  LMP 11/06/2010  Fetal Heart FHR: 120 bpm, variability: moderate,  accelerations:  Present,  decelerations:  Absent   Contractions: q2-4, pitocin augmentation  SVE:   Dilation: 5.5 Effacement (%): 90 Station: -2 Exam by:: Lorretta Harp RNC  Telemetry: normal sinus rhythm with few PVCs.  Assessment / Plan:  Labor: active Fetal Wellbeing: Cat1 Pain Control:  Adequate with epidura; Expected mode of delivery: NSVD  Jove Beyl 08/22/2011, 6:14 PM

## 2011-08-22 NOTE — Anesthesia Procedure Notes (Signed)
Epidural Patient location during procedure: OB Start time: 08/22/2011 1:49 PM  Staffing Anesthesiologist: FOSTER, MICHAEL A. Performed by: anesthesiologist   Preanesthetic Checklist Completed: patient identified, site marked, surgical consent, pre-op evaluation, timeout performed, IV checked, risks and benefits discussed and monitors and equipment checked  Epidural Patient position: sitting Prep: site prepped and draped and DuraPrep Patient monitoring: continuous pulse ox and blood pressure Approach: midline Injection technique: LOR air  Needle:  Needle type: Tuohy  Needle gauge: 17 G Needle length: 9 cm Needle insertion depth: 7 cm Catheter type: closed end flexible Catheter size: 19 Gauge Catheter at skin depth: 12 cm Test dose: negative and Other  Assessment Events: blood not aspirated, injection not painful, no injection resistance, negative IV test and no paresthesia  Additional Notes .kj

## 2011-08-22 NOTE — Progress Notes (Signed)
Instructed L/D RN to reposition patient and pt. Was unaware of pvc's at present

## 2011-08-22 NOTE — Progress Notes (Signed)
I spoke to pt and spouse after being called by [facility] charge nurse Cleone Slim RN. Pt and spouse had expressed anger and fear regarding a conversation had last pm with Dr. Sheral Apley of anesthesia, and pt had stated "don't let him near me". Now pt continues labor with minimal cervical change / progress and same MD is on tonight. I apologized for fear and anxiety caused by the situation, assured couple that we are well-equipped and prepared to care for her. Pt states that per her cardiologist Dr. Clide Cliff there are no restrictions on her laboring or delivering vaginally. Pt agrees to allow MD in room if needed to maintain epidural (already in place) or in case of C-section delivery. Pt currently in asymptomatic trigeminy per tele.

## 2011-08-23 ENCOUNTER — Encounter (HOSPITAL_COMMUNITY): Payer: Self-pay

## 2011-08-23 DIAGNOSIS — O9942 Diseases of the circulatory system complicating childbirth: Secondary | ICD-10-CM

## 2011-08-23 DIAGNOSIS — I2510 Atherosclerotic heart disease of native coronary artery without angina pectoris: Secondary | ICD-10-CM

## 2011-08-23 DIAGNOSIS — O480 Post-term pregnancy: Secondary | ICD-10-CM

## 2011-08-23 DIAGNOSIS — I4949 Other premature depolarization: Secondary | ICD-10-CM

## 2011-08-23 LAB — CBC
HCT: 34.9 % — ABNORMAL LOW (ref 36.0–46.0)
MCHC: 33.8 g/dL (ref 30.0–36.0)
RDW: 13.1 % (ref 11.5–15.5)

## 2011-08-23 MED ORDER — MISOPROSTOL 200 MCG PO TABS
ORAL_TABLET | ORAL | Status: AC
  Administered 2011-08-23: 800 ug via RECTAL
  Filled 2011-08-23: qty 4

## 2011-08-23 MED ORDER — OXYCODONE-ACETAMINOPHEN 5-325 MG PO TABS: 1.0000 | ORAL_TABLET | ORAL | Status: DC | PRN

## 2011-08-23 MED ORDER — ZOLPIDEM TARTRATE 5 MG PO TABS: 5.0000 mg | ORAL_TABLET | Freq: Every evening | ORAL | Status: DC | PRN

## 2011-08-23 MED ORDER — IBUPROFEN 600 MG PO TABS
600.0000 mg | ORAL_TABLET | Freq: Four times a day (QID) | ORAL | Status: DC
  Administered 2011-08-23 – 2011-08-25 (×5): 600 mg via ORAL
  Filled 2011-08-23 (×7): qty 1

## 2011-08-23 MED ORDER — ONDANSETRON HCL 4 MG/2ML IJ SOLN: 4.0000 mg | INTRAMUSCULAR | Status: DC | PRN

## 2011-08-23 MED ORDER — SENNOSIDES-DOCUSATE SODIUM 8.6-50 MG PO TABS
2.0000 | ORAL_TABLET | Freq: Every day | ORAL | Status: DC
  Administered 2011-08-23: 2 via ORAL

## 2011-08-23 MED ORDER — WITCH HAZEL-GLYCERIN EX PADS: 1.0000 "application " | MEDICATED_PAD | CUTANEOUS | Status: DC | PRN

## 2011-08-23 MED ORDER — PRENATAL MULTIVITAMIN CH
1.0000 | ORAL_TABLET | Freq: Every morning | ORAL | Status: DC
  Administered 2011-08-24 – 2011-08-25 (×2): 1 via ORAL
  Filled 2011-08-23 (×2): qty 1

## 2011-08-23 MED ORDER — GENTAMICIN SULFATE 40 MG/ML IJ SOLN
Freq: Three times a day (TID) | INTRAVENOUS | Status: DC
  Administered 2011-08-23: 16:00:00 via INTRAVENOUS
  Filled 2011-08-23 (×2): qty 5

## 2011-08-23 MED ORDER — DIPHENHYDRAMINE HCL 25 MG PO CAPS: 25.0000 mg | ORAL_CAPSULE | Freq: Four times a day (QID) | ORAL | Status: DC | PRN

## 2011-08-23 MED ORDER — TETANUS-DIPHTH-ACELL PERTUSSIS 5-2.5-18.5 LF-MCG/0.5 IM SUSP
0.5000 mL | Freq: Once | INTRAMUSCULAR | Status: DC
  Filled 2011-08-23: qty 0.5

## 2011-08-23 MED ORDER — DIBUCAINE 1 % RE OINT: 1.0000 "application " | TOPICAL_OINTMENT | RECTAL | Status: DC | PRN

## 2011-08-23 MED ORDER — BENZOCAINE-MENTHOL 20-0.5 % EX AERO
1.0000 "application " | INHALATION_SPRAY | CUTANEOUS | Status: DC | PRN
  Filled 2011-08-23: qty 56

## 2011-08-23 MED ORDER — CLINDAMYCIN PHOSPHATE 900 MG/50ML IV SOLN: 900.0000 mg | Freq: Three times a day (TID) | INTRAVENOUS | Status: DC

## 2011-08-23 MED ORDER — LANOLIN HYDROUS EX OINT: TOPICAL_OINTMENT | CUTANEOUS | Status: DC | PRN

## 2011-08-23 MED ORDER — SIMETHICONE 80 MG PO CHEW: 80.0000 mg | CHEWABLE_TABLET | ORAL | Status: DC | PRN

## 2011-08-23 MED ORDER — ONDANSETRON HCL 4 MG PO TABS: 4.0000 mg | ORAL_TABLET | ORAL | Status: DC | PRN

## 2011-08-23 MED ORDER — PRENATAL MULTIVITAMIN CH: 1.0000 | ORAL_TABLET | Freq: Every day | ORAL | Status: DC

## 2011-08-23 MED ORDER — GENTAMICIN SULFATE 40 MG/ML IJ SOLN
Freq: Three times a day (TID) | INTRAVENOUS | Status: DC
  Administered 2011-08-23: via INTRAVENOUS
  Filled 2011-08-23 (×3): qty 5

## 2011-08-23 NOTE — Progress Notes (Signed)
Patient ID: [name], female   DOB: 30-Jun-1985, 26 y.o.   MRN: [id number]  Doing well. Comfortable with epidural.  Denies any chest pain or dizziness.  AVSS HR per AICU RN shows sinus rhythm with PVCs, some bi- and trigeminy  FHR reactive UCs regular every 2-3 minutes  Cervix deferred  Discussed plan of care.  Continue Pitocin.

## 2011-08-23 NOTE — Progress Notes (Signed)
[name] is a 26 y.o. G4P0030 at [redacted]w[redacted]d   Subjective: No Complaints. Sleeping comfortably. Pain well controlled.   Objective: BP 111/69  Pulse 58  Temp(Src) 98.5 F (36.9 C) (Oral)  Resp 20  Ht 5\' 6"  (1.676 m)  Wt 106.142 kg (234 lb)  BMI 37.77 kg/m2  SpO2 99%  LMP 11/06/2010      FHT:  FHR: 130s bpm, variability: moderate,  accelerations:  Present,  decelerations:  Absent UC:   irregular, every 2-4 minutes SVE:   Dilation: 6 Effacement (%): 90 Station: 0 Exam by:: Danella Deis, RNC-OB, C-EFM  Labs: Lab Results  Component Value Date   WBC 16.4* 08/21/2011   HGB 13.3 08/21/2011   HCT 38.5 08/21/2011   MCV 88.5 08/21/2011   PLT 126* 08/21/2011    Assessment / Plan: Induction of labor due to postterm,  progressing well on pitocin  Labor: Progressing slowly on Pit. IUPC in place.  Fetal Wellbeing:  Category I Pain Control:  Epidural I/D:  n/a Anticipated MOD:  NSVD  Ahnna Dungan 08/23/2011, 2:08 AM

## 2011-08-23 NOTE — Progress Notes (Signed)
Update with L&D RN, Pt is resting well with epidural in place, denies c/o with frequent PVC's. V/S WNL

## 2011-08-23 NOTE — Progress Notes (Signed)
   Subjective: Pt reports pressure with contractions; comfortable with epidural.  Denies chest pain.    Objective: BP 127/65  Pulse 58  Temp(Src) 98.7 F (37.1 C) (Oral)  Resp 16  Ht 5\' 6"  (1.676 m)  Wt 106.142 kg (234 lb)  BMI 37.77 kg/m2  SpO2 99%  LMP 11/06/2010 I/O last 3 completed shifts: In: -  Out: 1200 [Urine:1200]    FHT:  FHR: 130's bpm, variability: moderate,  accelerations:  Present,  decelerations:  Present early decels.  UC:   regular, every 2-3 minutes SVE:   Dilation: Lip/rim Effacement (%): 100 Station: +1 Exam by:: W.Muhammad, CNM  Labs: Lab Results  Component Value Date   WBC 16.4* 08/21/2011   HGB 13.3 08/21/2011   HCT 38.5 08/21/2011   MCV 88.5 08/21/2011   PLT 126* 08/21/2011    Assessment / Plan: Induction of labor; progressing well  Labor: Progressing on Pitocin Preeclampsia:  n/a Fetal Wellbeing:  Category I Pain Control:  Epidural I/D:  n/a Anticipated MOD:  NSVD  [facility] 08/23/2011, 9:52 AM

## 2011-08-23 NOTE — Progress Notes (Signed)
ANTIBIOTIC CONSULT NOTE - INITIAL  Pharmacy Consult for Gentamicin Indication: Maternal temp/ r/o Chorioamnionitis  Allergies  Allergen Reactions  . Peanut-Containing Drug Products Hives and Swelling  . Penicillins Other (See Comments)    Childhood allergy; reaction unknown    Patient Measurements: Height: 5\' 6"  (167.6 cm) Weight: 234 lb (106.142 kg) IBW/kg (Calculated) : 59.3  Adjusted Body Weight: 73.3kg  Vital Signs: Temp: 100 F (37.8 C) (05/04 1330) Temp src: Oral (05/04 1330) BP: 131/77 mmHg (05/04 1500) Pulse Rate: 55  (05/04 1500) I  Labs:  Basename 08/21/11 2020  WBC 16.4*  HGB 13.3  PLT 126*  LAB[facility]A --  CREATININE --   Estimated Creatinine Clearance: 134.7 ml/min (by C-G formula based on Cr of 0.54). SCr based on labs from 07/14/11  Medical History: Past Medical History  Diagnosis Date  . Heart murmur     born with condition  . Chlamydia   . Dysrhythmia   . Thrombocytopenia     Medications:  Clindamycin 900mg  IV q8h  Assessment: 25yo F admitted for IOL. Gentamicin initiated for maternal temp of > 100 to r/o chorioamnionitis.   Goal of Therapy:  Gentamicin peaks 6-8 mcg/ml and troughs < 1 mcg/ml.  Plan:  1. Gentamicin 200mg  IV q8h. 2. Will continue to follow and check levels as indicated. Thanks!  Claybon Jabs 08/23/2011,3:55 PM

## 2011-08-23 NOTE — Progress Notes (Signed)
   Subjective: Pt reports feeling pressure with contractions.  Denies chest pain.    Objective: BP 131/77  Pulse 55  Temp(Src) 100 F (37.8 C) (Oral)  Resp 16  Ht 5\' 6"  (1.676 m)  Wt 106.142 kg (234 lb)  BMI 37.77 kg/m2  SpO2 99%  LMP 11/06/2010 I/O last 3 completed shifts: In: -  Out: 1200 [Urine:1200]    FHT:  FHR: 130's bpm, variability: moderate,  accelerations:  Present,  decelerations:  Absent UC:   irregular, every 2-3 minutes SVE:   Dilation: 10 Effacement (%): 100 Station: +1 Exam by:: Roney Marion CNM  Labs: Lab Results  Component Value Date   WBC 16.4* 08/21/2011   HGB 13.3 08/21/2011   HCT 38.5 08/21/2011   MCV 88.5 08/21/2011   PLT 126* 08/21/2011   Dr. Shawnie Pons to assess pt to determine when vacuum will be applied/utilized.    Assessment / Plan: Augmentation of labor, progressing well  Labor: Progressing normally Preeclampsia:  n/a Fetal Wellbeing:  Category I Pain Control:  Epidural I/D:  n/a Anticipated MOD:  NSVD  [facility] 08/23/2011, 3:19 PM

## 2011-08-23 NOTE — Progress Notes (Signed)
Comfortable  AVSS:   Filed Vitals:   08/23/11 0601 08/23/11 0603 08/23/11 0631 08/23/11 0701  BP: 127/91 122/95 123/85 116/84  Pulse: 112 120 98 119  Temp: 98.7 F (37.1 C)     TempSrc: Oral     Resp: 18  16   Height:      Weight:      SpO2:       EKG:  Frequent PVCs  FHR 130s with good variability. No decels  UCs irregular every 2-4 minutes  Cervix:  Dilation: 7 Effacement (%): 100 Cervical Position: Anterior Station: 0 Presentation: Vertex Exam by:: Danella Deis, RNC-OB, C-EFM  Will continue to observe.

## 2011-08-24 LAB — COMPREHENSIVE METABOLIC PANEL
ALT: 18 U/L (ref 0–35)
Albumin: 2.1 g/dL — ABNORMAL LOW (ref 3.5–5.2)
Alkaline Phosphatase: 128 U/L — ABNORMAL HIGH (ref 39–117)
Chloride: 106 mEq/L (ref 96–112)
Glucose, Bld: 67 mg/dL — ABNORMAL LOW (ref 70–99)
Potassium: 3.7 mEq/L (ref 3.5–5.1)
Sodium: 136 mEq/L (ref 135–145)
Total Bilirubin: 0.2 mg/dL — ABNORMAL LOW (ref 0.3–1.2)
Total Protein: 4.7 g/dL — ABNORMAL LOW (ref 6.0–8.3)

## 2011-08-24 LAB — CBC
MCH: 30.4 pg (ref 26.0–34.0)
MCV: 89.1 fL (ref 78.0–100.0)
Platelets: 96 10*3/uL — ABNORMAL LOW (ref 150–400)
RBC: 3.39 MIL/uL — ABNORMAL LOW (ref 3.87–5.11)
RDW: 13.4 % (ref 11.5–15.5)

## 2011-08-24 MED ORDER — NICOTINE 7 MG/24HR TD PT24
7.0000 mg | MEDICATED_PATCH | Freq: Every day | TRANSDERMAL | Status: DC
  Filled 2011-08-24 (×3): qty 1

## 2011-08-24 NOTE — Progress Notes (Signed)
Lab called to report that pt will not need RhoGam-baby is Oneg.

## 2011-08-24 NOTE — Progress Notes (Signed)
Results for GISEL, VIPOND (MRN [id number]) as of 08/24/2011 09:43  Ref. Range 08/24/2011 05:15  Sodium Latest Range: 135-145 mEq/L 136  Potassium Latest Range: 3.5-5.1 mEq/L 3.7  Chloride Latest Range: 96-112 mEq/L 106  CO2 Latest Range: 19-32 mEq/L 23  BUN Latest Range: 6-23 mg/dL 12  Creat Latest Range: 0.50-1.10 mg/dL 4.09  Calcium Latest Range: 8.4-10.5 mg/dL 8.5  GFR calc non Af Amer Latest Range: >90 mL/min 85 (L)  GFR calc Af Amer Latest Range: >90 mL/min >90  Glucose Latest Range: 70-99 mg/dL 67 (L)  Alkaline Phosphatase Latest Range: 39-117 U/L 128 (H)  Albumin Latest Range: 3.5-5.2 g/dL 2.1 (L)  AST Latest Range: 0-37 U/L 57 (H)  ALT Latest Range: 0-35 U/L 18  Total Protein Latest Range: 6.0-8.3 g/dL 4.7 (L)  Total Bilirubin Latest Range: 0.3-1.2 mg/dL 0.2 (L)   Results called to MD-no new orders recieved

## 2011-08-24 NOTE — Progress Notes (Signed)
08/24/11 1810 Pt transferred ambulatory to [facility] rm #[id number] Judeth Cornfield, RN updated.

## 2011-08-24 NOTE — Plan of Care (Signed)
Platelet count 96.  Notified Dr. Adrian Blackwater to be sure Ibuprofen was still acceptable to give to patient q6 hours.  Dr. Adrian Blackwater stated that Ibuprofen was okay to continue to give.  Will continue to monitor patient.  Earl Gala, Linda [place]edges [place]

## 2011-08-24 NOTE — Progress Notes (Signed)
08/24/11 1705 SBar reort to [place], RN on [place] for pt transfer to Rm #[id number] - awaiting FOB's return.

## 2011-08-24 NOTE — Progress Notes (Signed)
Post Partum Day 1 Subjective: no complaints, up ad lib, voiding and tolerating PO Few PVC's, but no more bi or trigeminy.  Objective: Blood pressure 120/70, pulse 101, temperature 98 F (36.7 C), temperature source Oral, resp. rate 21, height 5\' 6"  (1.676 m), weight 108.092 kg (238 lb 4.8 oz), last menstrual period 11/06/2010, SpO2 99.00%, unknown if currently breastfeeding.  Physical Exam:  General: alert, cooperative and appears stated age Lochia: appropriate Uterine Fundus: firm DVT Evaluation: No evidence of DVT seen on physical exam.   Basename 08/24/11 0515 08/23/11 1746  HGB 10.3* 11.8*  HCT 30.2* 34.9*    Assessment/Plan: Plan for discharge tomorrow, Breastfeeding and Contraception Depo Transfer to floor once 24 hours pp.   LOS: 3 days   Earmon Sherrow S 08/24/2011, 7:41 AM

## 2011-08-24 NOTE — Anesthesia Postprocedure Evaluation (Signed)
  Anesthesia Post-op Note  Patient: [name]  Procedure(s) Performed: * No procedures listed *  Patient Location: A-ICU  Anesthesia Type: Epidural  Level of Consciousness: awake, alert  and oriented  Airway and Oxygen Therapy: Patient Spontanous Breathing  Post-op Pain: mild  Post-op Assessment: Patient's Cardiovascular Status Stable, Respiratory Function Stable, Patent Airway, No signs of Nausea or vomiting and Pain level controlled  Post-op Vital Signs: stable  Complications: No apparent anesthesia complications

## 2011-08-25 MED ORDER — IBUPROFEN 600 MG PO TABS: 600.0000 mg | ORAL_TABLET | Freq: Four times a day (QID) | ORAL | Status: AC

## 2011-08-25 NOTE — Progress Notes (Signed)
UR chart review completed.  

## 2011-08-25 NOTE — H&P (Signed)
Agree with above note.  [name] 08/25/2011 2:21 PM

## 2011-08-25 NOTE — Clinical Social Work Maternal (Signed)
    Clinical Social Work Department PSYCHOSOCIAL ASSESSMENT - MATERNAL/CHILD 08/25/2011  Patient:  [name], [name]  Account Number:  [id number]  Admit Date:  08/21/2011  Marjo Bicker Name:   [name]    Clinical Social Worker:  Andy Gauss   Date/Time:  08/25/2011 10:00 AM  Date Referred:  08/25/2011   Referral source  CN     Referred reason  Substance Abuse   Other referral source:    I:  FAMILY / HOME ENVIRONMENT Child's legal guardian:  PARENT  Guardian - Name Guardian - Age Guardian - Address  [name] [address] [name]; [place], [place] [address]  [name] 24 (same as above)   Other household support members/support persons Other support:    II  PSYCHOSOCIAL DATA Information Source:    Event organiser Employment:   Financial resources:  Self Pay If [employer]:   Building services engineer / Grade:   Maternity Care Coordinator / Child Services Coordination / Early Interventions:  Cultural issues impacting care:    III  STRENGTHS Strengths  Adequate Resources  Home prepared for Child (including basic supplies)  Supportive family/friends   Strength comment:    IV  RISK FACTORS AND CURRENT PROBLEMS Current Problem:  None   Risk Factor & Current Problem Patient Issue Family Issue Risk Factor / Current Problem Comment   Y N Hx of MJ & Cocaine    V  SOCIAL WORK ASSESSMENT Pt admits to smoking MJ, "3 times a week," prior to pregnancy confirmation at 2-3 weeks.  Once pregnancy was confirmed, she reports that she stopped immediately.  She acknowledges history of cocaine use, stating she last use in July 2012.  According to the pt, she did not use cocaine regularly.    Sw explained hospital drug testing policy. UDS is negative, meconium results are pending.  She reports having all the necessary supplies for the infant.  FOB is at the bedside and supportive.  Sw will follow up with drug screen results and make a referral  if needed.      VI SOCIAL WORK PLAN Social Work Plan  No Further Intervention Required / No Barriers to Discharge   Type of pt/family education:   If child protective services report - county:   If child protective services report - date:   Information/referral to community resources comment:   Other social work plan:

## 2011-08-25 NOTE — Discharge Summary (Signed)
Obstetric Discharge Summary Reason for Admission: induction of labor due to postdates Prenatal Procedures: ultrasound and cardiology evaluation Intrapartum Procedures: spontaneous vaginal delivery, Gentamycin, Pt on Telemetry Postpartum Procedures: none Pt on telemetry w/o significant event Complications-Operative and Postpartum: none Hemoglobin  Date Value Range Status  08/24/2011 10.3* 12.0-15.0 (g/dL) Final     HCT  Date Value Range Status  08/24/2011 30.2* 36.0-46.0 (%) Final    Physical Exam:  General: alert, cooperative, appears stated age and no distress Lochia: appropriate Uterine Fundus: firm DVT Evaluation: No evidence of DVT seen on physical exam.  Discharge Diagnoses: Post-date pregnancy  Discharge Information: Date: 08/25/2011 Activity: unrestricted and pelvic rest Diet: routine Medications: PNV and Ibuprofen Condition: stable Instructions: refer to practice specific booklet Discharge to: home  Contraception: Pt to receive depo prior to leaving  Hospital. IUD in 3 months  Newborn Data: Live born female  Birth Weight: 8 lb 7.8 oz (3850 g) APGAR: 7, 9  Home with mother.  MERRELL, DAVID 08/25/2011, 9:02 AM  Patient seen and examined.  Agree with above note.  [name] 08/25/2011 4:54 PM

## 2011-08-25 NOTE — Discharge Instructions (Signed)

## 2011-09-04 ENCOUNTER — Encounter: Payer: Self-pay | Admitting: *Deleted

## 2011-09-04 ENCOUNTER — Ambulatory Visit: Payer: Self-pay | Admitting: Internal Medicine

## 2011-09-11 ENCOUNTER — Encounter: Payer: Self-pay | Admitting: Internal Medicine

## 2011-09-22 ENCOUNTER — Ambulatory Visit: Payer: Self-pay | Admitting: Family

## 2011-10-16 ENCOUNTER — Ambulatory Visit (INDEPENDENT_AMBULATORY_CARE_PROVIDER_SITE_OTHER): Payer: Medicaid Other | Admitting: Obstetrics and Gynecology

## 2011-10-16 ENCOUNTER — Encounter: Payer: Self-pay | Admitting: Obstetrics and Gynecology

## 2011-10-16 DIAGNOSIS — D696 Thrombocytopenia, unspecified: Secondary | ICD-10-CM | POA: Insufficient documentation

## 2011-10-16 DIAGNOSIS — Z3049 Encounter for surveillance of other contraceptives: Secondary | ICD-10-CM

## 2011-10-16 MED ORDER — MEDROXYPROGESTERONE ACETATE 150 MG/ML IM SUSP: 150.0000 mg | INTRAMUSCULAR | Status: DC

## 2011-10-16 MED ORDER — MEDROXYPROGESTERONE ACETATE 150 MG/ML IM SUSP
150.0000 mg | Freq: Once | INTRAMUSCULAR | Status: AC
  Administered 2011-10-16: 150 mg via INTRAMUSCULAR

## 2011-10-16 NOTE — Progress Notes (Signed)
  Subjective:     [name] is a 26 y.o. female who presents for a postpartum visit. She is 7 weeks postpartum following a spontaneous vaginal delivery. I have fully reviewed the prenatal and intrapartum course. The delivery was at 41.2 gestational weeks.  She had induction of labor for post dates. She also had history of PVCs and was on atenolol through Dr. Graciela Hus[facility] but this was DC'd before she delivered. She denies having any problems since. Of note her platelets were 96,000 on 08/24/2011. Outcome: spontaneous vaginal delivery. Anesthesia: epidural. Postpartum course has been uncomplicated Baby's course has been uncomplicated. Baby is feeding by breast. Bleeding staining only. Bowel function is normal. Bladder function is normal. Patient is not sexually active. Contraception method is abstinence. Would like Depo.  Postpartum depression screening: negative.  The following portions of the patient's history were reviewed and updated as appropriate: allergies, current medications, past family history, past medical history, past social history, past surgical history and problem list. Smokes 4 cig/day. Review of Systems Pertinent items are noted in HPI.   Objective:    Breastfeeding? Unknown  General:  alert, cooperative and no distress   Breasts:  inspection negative, no nipple discharge or bleeding, no masses or nodularity palpable and lactational  Lungs: clear to auscultation bilaterally  Heart:  regular rate and rhythm, S1, S2 normal, no murmur, click, rub or gallop  Abdomen: soft, non-tender; bowel sounds normal; no masses,  no organomegaly   Vulva:  normal  Vagina: normal vagina, no discharge, exudate, lesion, or erythema  Cervix:  multiparous appearance  Corpus: normal size, contour, position, consistency, mobility, non-tender  Adnexa:  normal adnexa  Rectal Exam: Not performed.        Assessment:    7 wks postpartum exam. Pap smear not done at today's visit.    Thrombocytopenia  Plan:    1. Contraception: abstinence 2. UPT neg. Will get Depo today 3. Smoking cessation discussed. 4. CBC 5. Follow up in: 3 months or as needed.

## 2011-10-16 NOTE — Addendum Note (Signed)
Addended by: Faythe Casa on: 10/16/2011 02:21 PM   Modules accepted: Orders

## 2011-10-16 NOTE — Patient Instructions (Signed)
Fluphenazine depot injection What is this medicine? FLUPHENAZINE (floo FEN a zeen) is used to treat the symptoms of schizophrenia and other mental disorders. Not all fluphenazine injections are the same. Do not change the brand you are using without checking with your doctor or health care professional. This medicine may be used for other purposes; ask your health care provider or pharmacist if you have questions. What should I tell my health care provider before I take this medicine? They need to know if you have any of these conditions: -blood disorders or disease -dementia -head injury -heart disease -liver disease -Parkinson's disease -Reye's syndrome -uncontrollable movement disorder -an unusual or allergic reaction to fluphenazine, other medicines foods, dyes, or preservatives -pregnant or trying to get pregnant -breast-feeding How should I use this medicine? This medicine is for injection into a muscle or under the skin. It is given by a health-care professional in a hospital or clinic setting. Talk to your pediatrician regarding the use of this medicine in children. Special care may be needed. Overdosage: If you think you have taken too much of this medicine contact a poison control center or emergency room at once. NOTE: This medicine is only for you. Do not share this medicine with others. What if I miss a dose? This does not apply. What may interact with this medicine? Do not take this medicine with any of the following medications: -amoxapine -certain antibiotics like gatifloxacin, grepafloxacin, sparfloxacin -cisapride -clozapine -dofetilide -droperidol -ephedrine -medicines for mental depression like escitalopram, fluoxetine, paroxetine, sertraline -ibutilide -levomethadyl -maprotiline -other phenothiazines like chlorpromazine, mesoridazine, prochlorperazine, and  thioridazine -pimozide -pindolol -propranolol -risperidone -sotalol -trimethobenzamide -ziprasidone This medicine may also interact with the following medications: -atropine -some medications for high blood pressure or heart problems This list may not describe all possible interactions. Give your health care provider a list of all the medicines, herbs, non-prescription drugs, or dietary supplements you use. Also tell them if you smoke, drink alcohol, or use illegal drugs. Some items may interact with your medicine. What should I watch for while using this medicine? You may get drowsy or dizzy. Do not drive, use machinery, or do anything that needs mental alertness until you know how this medicine affects you. Do not stand or sit up quickly, especially if you are an older patient. This reduces the risk of dizzy or fainting spells. Alcohol may interfere with the effect of this medicine. Avoid alcoholic drinks. This medicine can reduce the response of your body to heat or cold. Try not to get overheated. Avoid temperature extremes, such as saunas, hot tubs, or very hot or cold baths or showers. Dress warmly in cold weather. This medicine can make you more sensitive to the sun. Keep out of the sun. If you cannot avoid being in the sun, wear protective clothing and use sunscreen. Do not use sun lamps or tanning beds/booths. Your mouth may get dry. Chewing sugarless gum or sucking hard candy, and drinking plenty of water may help. Contact your doctor if the problem does not go away or is severe. If you are going to have surgery, tell your doctor or health care professional that you are taking this medicine. What side effects may I notice from receiving this medicine? Side effects that you should report to your doctor or health care professional as soon as possible: -allergic reactions like skin rash, itching or hives, swelling of the face, lips, or tongue -blurred vision -breast enlargement in men or  women -breast milk in women who are  not breast-feeding -chest pain, fast or irregular heartbeat -confusion, restlessness -dark yellow or brown urine -difficulty breathing or swallowing -dizziness or fainting spells -drooling, shaking -fever, chills, sore throat -involuntary or uncontrollable movements of the eyes, mouth, head, arms, and legs -seizures -stomach area pain -unusual bleeding or bruising -unusually weak or tired -yellowing of skin or eyes Side effects that usually do not require medical attention (report to your doctor or health care professional if they continue or are bothersome): -difficulty passing urine -difficulty sleeping -headache -sexual dysfunction This list may not describe all possible side effects. Call your doctor for medical advice about side effects. You may report side effects to FDA at 1-800-FDA-1088. Where should I keep my medicine? This drug is given in a hospital or clinic and will not be stored at home. NOTE: This sheet is a summary. It may not cover all possible information. If you have questions about this medicine, talk to your doctor, pharmacist, or health care provider.  2012, [employer] Standard. (08/16/2007 4:14:15 PM)Smoking Cessation This document explains the best ways for you to quit smoking and new treatments to help. It lists new medicines that can double or triple your chances of quitting and quitting for good. It also considers ways to avoid relapses and concerns you may have about quitting, including weight gain. NICOTINE: A POWERFUL ADDICTION If you have tried to quit smoking, you know how hard it can be. It is hard because nicotine is a very addictive drug. For some people, it can be as addictive as heroin or cocaine. Usually, people make 2 or 3 tries, or more, before finally being able to quit. Each time you try to quit, you can learn about what helps and what hurts. Quitting takes hard work and a lot of effort, but you can quit  smoking. QUITTING SMOKING IS ONE OF THE MOST IMPORTANT THINGS YOU WILL EVER DO.  You will live longer, feel better, and live better.   The impact on your body of quitting smoking is felt almost immediately:   Within 20 minutes, blood pressure decreases. Pulse returns to its normal level.   After 8 hours, carbon monoxide levels in the blood return to normal. Oxygen level increases.   After 24 hours, chance of heart attack starts to decrease. Breath, hair, and body stop smelling like smoke.   After 48 hours, damaged nerve endings begin to recover. Sense of taste and smell improve.   After 72 hours, the body is virtually free of nicotine. Bronchial tubes relax and breathing becomes easier.   After 2 to 12 weeks, lungs can hold more air. Exercise becomes easier and circulation improves.   Quitting will reduce your risk of having a heart attack, stroke, cancer, or lung disease:   After 1 year, the risk of coronary heart disease is cut in half.   After 5 years, the risk of stroke falls to the same as a nonsmoker.   After 10 years, the risk of lung cancer is cut in half and the risk of other cancers decreases significantly.   After 15 years, the risk of coronary heart disease drops, usually to the level of a nonsmoker.   If you are pregnant, quitting smoking will improve your chances of having a healthy baby.   The people you live with, especially your children, will be healthier.   You will have extra money to spend on things other than cigarettes.  FIVE KEYS TO QUITTING Studies have shown that these 5 steps will help you  quit smoking and quit for good. You have the best chances of quitting if you use them together: 1. Get ready.  2. Get support and encouragement.  3. Learn new skills and behaviors.  4. Get medicine to reduce your nicotine addiction and use it correctly.  5. Be prepared for relapse or difficult situations. Be determined to continue trying to quit, even if you do not  succeed at first.  1. GET READY  Set a quit date.   Change your environment.   Get rid of ALL cigarettes, ashtrays, matches, and lighters in your home, car, and place of work.   Do not let people smoke in your home.   Review your past attempts to quit. Think about what worked and what did not.   Once you quit, do not smoke. NOT EVEN A PUFF!  2. GET SUPPORT AND ENCOURAGEMENT Studies have shown that you have a better chance of being successful if you have help. You can get support in many ways.  Tell your family, friends, and coworkers that you are going to quit and need their support. Ask them not to smoke around you.   Talk to your caregivers (doctor, dentist, nurse, pharmacist, psychologist, and/or smoking counselor).   Get individual, group, or telephone counseling and support. The more counseling you have, the better your chances are of quitting. Programs are available at [employer] and health centers. Call your local health department for information about programs in your area.   Spiritual beliefs and practices may help some smokers quit.   Quit meters are Photographer that keep track of quit statistics, such as amount of "quit-time," cigarettes not smoked, and money saved.   Many smokers find one or more of the many self-help books available useful in helping them quit and stay off tobacco.  3. LEARN NEW SKILLS AND BEHAVIORS  Try to distract yourself from urges to smoke. Talk to someone, go for a walk, or occupy your time with a task.   When you first try to quit, change your routine. Take a different route to work. Drink tea instead of coffee. Eat breakfast in a different place.   Do something to reduce your stress. Take a hot bath, exercise, or read a book.   Plan something enjoyable to do every day. Reward yourself for not smoking.   Explore interactive web-based programs that specialize in helping you quit.  4. GET MEDICINE AND  USE IT CORRECTLY Medicines can help you stop smoking and decrease the urge to smoke. Combining medicine with the above behavioral methods and support can quadruple your chances of successfully quitting smoking. The [place] Food and Drug Administration (FDA) has approved 7 medicines to help you quit smoking. These medicines fall into 3 categories.  Nicotine replacement therapy (delivers nicotine to your body without the negative effects and risks of smoking):   Nicotine gum: Available over-the-counter.   Nicotine lozenges: Available over-the-counter.   Nicotine inhaler: Available by prescription.   Nicotine nasal spray: Available by prescription.   Nicotine skin patches (transdermal): Available by prescription and over-the-counter.   Antidepressant medicine (helps people abstain from smoking, but how this works is unknown):   Bupropion sustained-release (SR) tablets: Available by prescription.   Nicotinic receptor partial agonist (simulates the effect of nicotine in your brain):   Varenicline tartrate tablets: Available by prescription.   Ask your caregiver for advice about which medicines to use and how to use them. Carefully read the information on the  package.   Everyone who is trying to quit may benefit from using a medicine. If you are pregnant or trying to become pregnant, nursing an infant, you are under age 31, or you smoke fewer than 10 cigarettes per day, talk to your caregiver before taking any nicotine replacement medicines.   You should stop using a nicotine replacement product and call your caregiver if you experience nausea, dizziness, weakness, vomiting, fast or irregular heartbeat, mouth problems with the lozenge or gum, or redness or swelling of the skin around the patch that does not go away.   Do not use any other product containing nicotine while using a nicotine replacement product.   Talk to your caregiver before using these products if you have diabetes, heart  disease, asthma, stomach ulcers, you had a recent heart attack, you have high blood pressure that is not controlled with medicine, a history of irregular heartbeat, or you have been prescribed medicine to help you quit smoking.  5. BE PREPARED FOR RELAPSE OR DIFFICULT SITUATIONS  Most relapses occur within the first 3 months after quitting. Do not be discouraged if you start smoking again. Remember, most people try several times before they finally quit.   You may have symptoms of withdrawal because your body is used to nicotine. You may crave cigarettes, be irritable, feel very hungry, cough often, get headaches, or have difficulty concentrating.   The withdrawal symptoms are only temporary. They are strongest when you first quit, but they will go away within 10 to 14 days.  Here are some difficult situations to watch for:  Alcohol. Avoid drinking alcohol. Drinking lowers your chances of successfully quitting.   Caffeine. Try to reduce the amount of caffeine you consume. It also lowers your chances of successfully quitting.   Other smokers. Being around smoking can make you want to smoke. Avoid smokers.   Weight gain. Many smokers will gain weight when they quit, usually less than 10 pounds. Eat a healthy diet and stay active. Do not let weight gain distract you from your main goal, quitting smoking. Some medicines that help you quit smoking may also help delay weight gain. You can always lose the weight gained after you quit.   Bad mood or depression. There are a lot of ways to improve your mood other than smoking.  If you are having problems with any of these situations, talk to your caregiver. SPECIAL SITUATIONS AND CONDITIONS Studies suggest that everyone can quit smoking. Your situation or condition can give you a special reason to quit.  Pregnant women/new mothers: By quitting, you protect your baby's health and your own.   Hospitalized patients: By quitting, you reduce health  problems and help healing.   Heart attack patients: By quitting, you reduce your risk of a second heart attack.   Lung, head, and neck cancer patients: By quitting, you reduce your chance of a second cancer.   Parents of children and adolescents: By quitting, you protect your children from illnesses caused by secondhand smoke.  QUESTIONS TO THINK ABOUT Think about the following questions before you try to stop smoking. You may want to talk about your answers with your caregiver.  Why do you want to quit?   If you tried to quit in the past, what helped and what did not?   What will be the most difficult situations for you after you quit? How will you plan to handle them?   Who can help you through the tough times? Your family?  Friends? Caregiver?   What pleasures do you get from smoking? What ways can you still get pleasure if you quit?  Here are some questions to ask your caregiver:  How can you help me to be successful at quitting?   What medicine do you think would be best for me and how should I take it?   What should I do if I need more help?   What is smoking withdrawal like? How can I get information on withdrawal?  Quitting takes hard work and a lot of effort, but you can quit smoking. FOR MORE INFORMATION  Smokefree.gov (http://www.davis-sullivan.com/) provides free, accurate, evidence-based information and professional assistance to help support the immediate and long-term needs of people trying to quit smoking. Document Released: 04/01/2001 Document Revised: 03/27/2011 Document Reviewed: 01/22/2009 [facility] Patient Information 2012 [place], [place].Place postpartum visit patient instructions here.

## 2011-10-27 ENCOUNTER — Telehealth: Payer: Self-pay | Admitting: *Deleted

## 2011-10-27 NOTE — Telephone Encounter (Signed)
[facility] called and left another message stating not sure if you are closed for the holiday, but please call me, is very important. Pasty Spillers and she states at her postpartum visit was given depo-provera injection she thought, but her discharge papers had information about a psychiatric med- reviewed her chart and informed her she did receive depo-provera injection, but I do see that she received infomration about a psychiatric medicine, I informed her that was probably in error by provider.  Patient also states she thinks she does not want to continue depo-provera, but not sure what she wants to do- we discussed a few methhods- and informed her she will need to change her appointment from nurse only to provider for whatever method she chooses( implanon, iud)- if wants pills can call and provider will send in order since we have seen her soon. Patient voices understanding.

## 2011-10-27 NOTE — Telephone Encounter (Signed)
[name] called last week and left a message she has a question about an injection , wants a call back. [name] called again 10/24/11 with a question about a shot the discharge papers said she was given and she states it  Is not anything she needs. Requests a call back.

## 2012-01-12 ENCOUNTER — Ambulatory Visit: Payer: Medicaid Other

## 2014-02-20 ENCOUNTER — Encounter: Payer: Self-pay | Admitting: Obstetrics and Gynecology

## 2016-10-23 ENCOUNTER — Encounter (HOSPITAL_COMMUNITY): Payer: Self-pay | Admitting: Emergency Medicine

## 2016-10-23 ENCOUNTER — Emergency Department (HOSPITAL_COMMUNITY)
Admission: EM | Admit: 2016-10-23 | Discharge: 2016-10-23 | Disposition: A | Discharge status: Home / Self Care | Payer: Medicaid Other | Attending: Emergency Medicine | Admitting: Emergency Medicine

## 2016-10-23 DIAGNOSIS — F17210 Nicotine dependence, cigarettes, uncomplicated: Secondary | ICD-10-CM | POA: Insufficient documentation

## 2016-10-23 DIAGNOSIS — R531 Weakness: Secondary | ICD-10-CM | POA: Insufficient documentation

## 2016-10-23 DIAGNOSIS — M791 Myalgia: Secondary | ICD-10-CM | POA: Insufficient documentation

## 2016-10-23 DIAGNOSIS — L03116 Cellulitis of left lower limb: Secondary | ICD-10-CM | POA: Insufficient documentation

## 2016-10-23 LAB — I-STAT BETA HCG BLOOD, ED (MC, WL, AP ONLY): I-stat hCG, quantitative: <5 m[IU]/mL (ref <5)

## 2016-10-23 MED ORDER — HYDROCODONE-ACETAMINOPHEN 5-325 MG PO TABS: 2.0000 | ORAL_TABLET | Freq: Four times a day (QID) | ORAL | 0 refills | Status: AC | PRN

## 2016-10-23 MED ORDER — DOXYCYCLINE HYCLATE 100 MG PO CAPS: 100.0000 mg | ORAL_CAPSULE | Freq: Two times a day (BID) | ORAL | 0 refills | Status: DC

## 2016-10-23 NOTE — Discharge Instructions (Signed)
Please read attached information regarding your condition. Take doxycycline twice daily for 10 days. Return to ED for worsening pain, increased redness, fever, vomiting, additional injury or bites.

## 2016-10-23 NOTE — ED Triage Notes (Addendum)
Abscess on l/low leg x1 week. Possible insect bite. 4 cm diameter red area noted. Pt reports clear secretions from "bite". Pt denies fever, reports generalized muscle aching

## 2016-10-23 NOTE — ED Provider Notes (Signed)
[facility]-EMERGENCY DEPT Provider Note   CSN: [id number] Arrival date & time: 10/23/16  1307  By signing my name below, I, Thelma Bargeick Cochran, attest that this documentation has been prepared under the direction and in the presence of Hosam Mcfetridge PA-C. Electronically Signed: Thelma BargeNick Cochran, Scribe. 10/23/16. 2:50 PM.  History   Chief Complaint Chief Complaint  Patient presents with  . Abscess    l/lower leg  . Generalized Body Aches   The history is provided by the patient. No language interpreter was used.    HPI Comments: [name] is a 31 y.o. female who presents to the Emergency Department complaining of constant, gradually worsening wound to her left lower leg that began 1 week ago. She has associated generalized body aches and weakness. She states she thinks an insect bit her but denies any direct contact with any insects. The wound was itchy, red, and painful, with clear and occasional yellow drainage. She has scratched the wound and tried to drain it. She notes the swelling has improved. She has tried using disinfectant spray with no relief. She denies fever, chills, and vomiting. Pt has allergies to penicillin, amoxicillin, benadryl, and clindamycin.  Past Medical History:  Diagnosis Date  . Chlamydia   . Dysrhythmia   . Heart murmur    born with condition  . PVC's (premature ventricular contractions)   . Thrombocytopenia [facility](HCC)     Patient Active Problem List   Diagnosis Date Noted  . Thrombocytopenia (HCC) 10/16/2011  . Normal delivery 08/24/2011  . 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 Thrombocytopenia, unspecified (HCC) 07/08/2011  . 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 boil     facial boil i&d  . DILATION AND CURETTAGE OF UTERUS     abortion x3    OB History    Gravida Para Term Preterm AB Living   4 1 1   3 1    SAB TAB Ectopic  Multiple Live Births           1       Home Medications    Prior to Admission medications   Medication Sig Start Date End Date Taking? Authorizing Provider  doxycycline (VIBRAMYCIN) 100 MG capsule Take 1 capsule (100 mg total) by mouth 2 (two) times daily. 10/23/16   Shabria Egley, PA-C  HYDROcodone-acetaminophen (NORCO/VICODIN) 5-325 MG tablet Take 2 tablets by mouth every 6 (six) hours as needed for severe pain. 10/23/16   Dietrich PatesKhatri, Lajoya Dombek, PA-C    Family History Family History  Problem Relation Age of Onset  . Emphysema Mother   . Heart attack Mother   . Heart attack Father   . Heart disease Father   . Hypertension Father   . Heart attack Paternal Grandmother   . Thyroid disease Paternal Grandmother   . Cancer Maternal Grandmother   . Hypertension Paternal Grandfather   . Anesthesia problems Neg Hx     Social History Social History  Substance Use Topics  . Smoking status: Current Every Day Smoker    Packs/day: 0.25    Years: 15.00    Types: Cigarettes  . Smokeless tobacco: Never Used  . Alcohol use Yes     Comment: occ     Allergies   Peanut-containing drug products; Penicillins; and Clindamycin/lincomycin   Review of Systems Review of Systems  Constitutional: Positive for fatigue. Negative for chills and fever.  Gastrointestinal: Negative for nausea and  vomiting.  Musculoskeletal: Positive for myalgias.  Skin: Positive for color change and wound.  Neurological: Negative for weakness and light-headedness.     Physical Exam Updated Vital Signs BP 129/84 (BP Location: Right Arm)   Pulse 90   Temp 98.2 F (36.8 C) (Oral)   Resp 16   LMP 09/30/2016 (Approximate)   SpO2 100%   Physical Exam  Constitutional: She appears well-developed and well-nourished. No distress.  HENT:  Head: Normocephalic and atraumatic.  Eyes: Conjunctivae and EOM are normal. No scleral icterus.  Neck: Normal range of motion.  Pulmonary/Chest: Effort normal. No respiratory distress.    Neurological: She is alert.  Skin: No rash noted. She is not diaphoretic.  Area of cellulitis as indicated in image on left shin No induration or fluctuance noted suggesting abscess There is no active drainage present Area intact to light touch 2+ dp pulse Full active ROM of the knee and ankle  Psychiatric: She has a normal mood and affect.  Nursing note and vitals reviewed.      ED Treatments / Results  DIAGNOSTIC STUDIES: Oxygen Saturation is 100% on RA, normal by my interpretation.    COORDINATION OF CARE: 2:47 PM Discussed treatment plan with pt at bedside and pt agreed to plan.  Labs (all labs ordered are listed, but only abnormal results are displayed) Labs Reviewed  I-STAT BETA HCG BLOOD, ED ([facility], [facility], [facility] ONLY)    EKG  EKG Interpretation None       Radiology No results found.  Procedures Procedures (including critical care time)  Medications Ordered in ED Medications - No data to display   Initial Impression / Assessment and Plan / ED Course  I have reviewed the triage vital signs and the nursing notes.  Pertinent labs & imaging results that were available during my care of the patient were reviewed by me and considered in my medical decision making (see chart for details).     Patient presents to ED for possible insect bite/infection that appeared one week ago. She states that the swelling has improved but that the erythema remains. She also complains of generalized body aches and fatigue. She is afebrile with no history of fever. She is nontoxic appearing and does not appear to be in distress at this time. She denies any previous skin infections in the past or history of MRSA. No sick contacts with similar symptoms. On physical exam there is an area of cellulitis present on the left shin. She has no joint involvement or concern for septic joint. Full active and passive range of motion of the knee and ankle. She denies any flulike symptoms. There is no area  that requires drainage at this time. Will give patient doxycycline and advised her to continue wound care as directed. Will give pain medication. [place] controlled substance database reviewed. She appears stable for discharge at this time. Strict return precautions given.  Final Clinical Impressions(s) / ED Diagnoses   Final diagnoses:  Cellulitis of left lower extremity    New Prescriptions New Prescriptions   DOXYCYCLINE (VIBRAMYCIN) 100 MG CAPSULE    Take 1 capsule (100 mg total) by mouth 2 (two) times daily.   HYDROCODONE-ACETAMINOPHEN (NORCO/VICODIN) 5-325 MG TABLET    Take 2 tablets by mouth every 6 (six) hours as needed for severe pain.   I personally performed the services described in this documentation, which was scribed in my presence. The recorded information has been reviewed and is accurate.     Stehanie Ekstrom, [place],  PA-C 10/23/16 1636    Doug Sou, MD 10/23/16 1712

## 2016-12-23 ENCOUNTER — Encounter (HOSPITAL_COMMUNITY): Payer: Self-pay

## 2016-12-23 ENCOUNTER — Emergency Department (HOSPITAL_COMMUNITY)
Admission: EM | Admit: 2016-12-23 | Discharge: 2016-12-24 | Disposition: A | Discharge status: Home / Self Care | Payer: Self-pay | Attending: Emergency Medicine | Admitting: Emergency Medicine

## 2016-12-23 DIAGNOSIS — F17210 Nicotine dependence, cigarettes, uncomplicated: Secondary | ICD-10-CM | POA: Insufficient documentation

## 2016-12-23 DIAGNOSIS — L0291 Cutaneous abscess, unspecified: Secondary | ICD-10-CM

## 2016-12-23 DIAGNOSIS — Z91010 Allergy to peanuts: Secondary | ICD-10-CM | POA: Insufficient documentation

## 2016-12-23 DIAGNOSIS — L0201 Cutaneous abscess of face: Secondary | ICD-10-CM | POA: Insufficient documentation

## 2016-12-23 MED ORDER — DOXYCYCLINE HYCLATE 100 MG PO TABS
100.0000 mg | ORAL_TABLET | Freq: Once | ORAL | Status: AC
  Administered 2016-12-24: 100 mg via ORAL
  Filled 2016-12-23: qty 1

## 2016-12-23 MED ORDER — IBUPROFEN 800 MG PO TABS
800.0000 mg | ORAL_TABLET | Freq: Once | ORAL | Status: AC
  Administered 2016-12-24: 800 mg via ORAL
  Filled 2016-12-23: qty 1

## 2016-12-23 MED ORDER — LIDOCAINE HCL (PF) 1 % IJ SOLN
5.0000 mL | Freq: Once | INTRAMUSCULAR | Status: AC
  Administered 2016-12-23: 5 mL via INTRADERMAL
  Filled 2016-12-23: qty 30

## 2016-12-23 NOTE — ED Triage Notes (Signed)
Patient states she got a tattoo on the left neck and now has left facial swelling . Patient also c/o right arm soreness, but no new tatoo on the right arm.

## 2016-12-23 NOTE — ED Provider Notes (Signed)
[facility]-EMERGENCY DEPT Provider Note   CSN: [id number] Arrival date & time: 12/23/16  1614     History   Chief Complaint Chief Complaint  Patient presents with  . Facial Swelling    HPI [name] is a 31 y.o. female.  The history is provided by the patient and medical records.    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  States she got a tattoo on the left side of her neck about 6 days ago, but this has been healing well. She does have one small scabbed area in the middle of the tattoo, but this is non tender and no swelling of the area. States about 3 days ago she started noticing some swelling in her lower chin. States she thinks she is developing an abscess. She reports she has had multiple abscesses in the past, has been tested for MRSA with culture multiple times but have all been negative. States she has had cellulitis in the past. She denies any dental pain.She does admit to some clear drainage from the area.  She denies fever or chills.  No intervention tried at home for this.  Patient also reports her right arm felt "sore" yesterday like she got a tetanus shot. She does admit to having to lie on her right side for several hours while getting the tattoo on the left side of her neck. She denies any numbness or weakness. She is right-hand dominant.  Past Medical History:  Diagnosis Date  . Chlamydia   . Dysrhythmia   . Heart murmur    born with condition  . PVC's (premature ventricular contractions)   . Thrombocytopenia [facility](HCC)     Patient Active Problem List   Diagnosis Date Noted  . Thrombocytopenia (HCC) 10/16/2011  . Normal delivery 08/24/2011  . 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 Thrombocytopenia, unspecified (HCC) 07/08/2011  . 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 boil     facial boil i&d  . DILATION AND CURETTAGE OF UTERUS     abortion x3    OB History    Gravida Para Term Preterm AB Living   4 1 1   3 1    SAB TAB Ectopic Multiple Live Births           1       Home Medications    Prior to Admission medications   Medication Sig Start Date End Date Taking? Authorizing Provider  doxycycline (VIBRAMYCIN) 100 MG capsule Take 1 capsule (100 mg total) by mouth 2 (two) times daily. Patient not taking: Reported on 12/23/2016 10/23/16   Dietrich PatesKhatri, [place], PA-C  HYDROcodone-acetaminophen (NORCO/VICODIN) 5-325 MG tablet Take 2 tablets by mouth every 6 (six) hours as needed for severe pain. Patient not taking: Reported on 12/23/2016 10/23/16   Dietrich PatesKhatri, [place], PA-C    Family History Family History  Problem Relation Age of Onset  . Emphysema Mother   . Heart attack Mother   . Heart attack Father   . Heart disease Father   . Hypertension Father   . Heart attack Paternal Grandmother   . Thyroid disease Paternal Grandmother   . Cancer Maternal Grandmother   . Hypertension Paternal Grandfather   . Anesthesia problems Neg Hx     Social History Social History  Substance Use Topics  . Smoking status: Current Every Day Smoker    Packs/day:  0.25    Years: 15.00    Types: Cigarettes  . Smokeless tobacco: Never Used  . Alcohol use Yes     Comment: occ     Allergies   Peanut-containing drug products; Penicillins; and Clindamycin/lincomycin   Review of Systems Review of Systems  HENT: Positive for facial swelling.   All other systems reviewed and are negative.    Physical Exam Updated Vital Signs BP 126/87 (BP Location: Left Arm)   Pulse 98   Temp 97.9 F (36.6 C) (Oral)   Resp 18   Ht 5' 4.5" (1.638 m)   Wt 74.2 kg (163 lb 9.6 oz)   LMP 12/02/2016   SpO2 100%   BMI 27.65 kg/m   Physical Exam  Constitutional: She is oriented to person, place, and time. She appears well-developed and well-nourished.  HENT:  Head: Normocephalic and atraumatic.    Mouth/Throat: Oropharynx is clear and moist.  Swelling localized to chin with scabbed area noted centrally; no extension of swelling into the neck or submental aras; small open area that appears to be draining clear fluid; some fluctuance noted beneath this; overlying erythema present; no tissue crepitus Dentition is poor but overall intact; no signs of dental abscess, no dental tenderness or pain; no tongue, lip, floor, or roof of mouth swelling, handling secretions well, normal phonation without stridor  Eyes: Pupils are equal, round, and reactive to light. Conjunctivae and EOM are normal.  Neck: Normal range of motion.  Large floral tattoo noted to left side of neck; some small areas of scabbing within but no swelling, erythema, or drainage from this area; no apparent neck swelling, no difficulty swallowing, airway patent  Cardiovascular: Normal rate, regular rhythm and normal heart sounds.   Pulmonary/Chest: Effort normal and breath sounds normal. No respiratory distress. She has no wheezes.  Abdominal: Soft. Bowel sounds are normal. There is no tenderness. There is no rebound.  Musculoskeletal: Normal range of motion.  Neurological: She is alert and oriented to person, place, and time.  Skin: Skin is warm and dry.  Multiple scabbed areas noted all over body  Psychiatric: She has a normal mood and affect.  Nursing note and vitals reviewed.    ED Treatments / Results  Labs (all labs ordered are listed, but only abnormal results are displayed) Labs Reviewed - No data to display  EKG  EKG Interpretation None       Radiology No results found.  Procedures Procedures (including critical care time)  INCISION AND DRAINAGE Performed by: Garlon Hatchet Consent: Verbal consent obtained. Risks and benefits: risks, benefits and alternatives were discussed Type: abscess  Body area:   Anesthesia: local infiltration  Incision was made with a scalpel.  Local anesthetic:  lidocaine 1% without epinephrine  Anesthetic total: 4 ml  Complexity: complex Blunt dissection to break up loculations  Drainage: purulent/bloody  Drainage amount: small  Packing material: none  Patient tolerance: Patient tolerated the procedure well with no immediate complications.     Medications Ordered in ED Medications  lidocaine (PF) (XYLOCAINE) 1 % injection 5 mL (5 mLs Intradermal Given by Other 12/23/16 2350)  ibuprofen (ADVIL,MOTRIN) tablet 800 mg (800 mg Oral Given 12/24/16 0014)  doxycycline (VIBRA-TABS) tablet 100 mg (100 mg Oral Given 12/24/16 0014)     Initial Impression / Assessment and Plan / ED Course  I have reviewed the triage vital signs and the nursing notes.  Pertinent labs & imaging results that were available during my care of the patient were  reviewed by me and considered in my medical decision making (see chart for details).  31 year old female here with swelling of her chin. Got a tattoo on the left side of her neck a few days ago, but denies any pain or swelling of this area and none noted on exam. She does have a small scabbed area of the chin with surrounding edema-- no extension into the neck or submental space. There is a small mount of clear drainage, area is fluctuant.  No difficulty swallowing, handling secretions well.  Denies any dental pain. No apparent dental abscess or oral swelling.  When examining patient, she does have multiple scabbed areas all over her body that she is picking at repetitively. I questioned about MRSA, however she denies history of this.  Simple I&D performed, small amount of purulent/bloody material expressed.  Patient does have some abx allergies, will start doxycycline as she reports good response to this in the past.  Given follow-up with ENT if any worsening symptoms.  Discussed plan with patient, she acknowledged understanding and agreed with plan of care.  Return precautions given for new or worsening symptoms.  Final  Clinical Impressions(s) / ED Diagnoses   Final diagnoses:  Abscess    New Prescriptions Discharge Medication List as of 12/24/2016 12:20 AM    START taking these medications   Details  ibuprofen (ADVIL,MOTRIN) 800 MG tablet Take 1 tablet (800 mg total) by mouth 3 (three) times daily., Starting Wed 12/24/2016, Print         Garlon Hatchet, PA-C 12/24/16 0309    Melene Plan, DO 12/24/16 1459

## 2016-12-24 MED ORDER — DOXYCYCLINE HYCLATE 100 MG PO CAPS: 100.0000 mg | ORAL_CAPSULE | Freq: Two times a day (BID) | ORAL | 0 refills | Status: AC

## 2016-12-24 MED ORDER — IBUPROFEN 800 MG PO TABS: 800.0000 mg | ORAL_TABLET | Freq: Three times a day (TID) | ORAL | 0 refills | Status: AC

## 2016-12-24 NOTE — ED Notes (Signed)
Pt. Requested to see/talk PA before discharge. Lisa,PA made aware.

## 2016-12-24 NOTE — Discharge Instructions (Signed)
Recommend warm compresses to the area a few times a day. Take the doxycycline as prescribed. Can follow-up with ENT if worsening-- can call for appt. Return here for any new/worsening symptoms

## 2017-06-03 ENCOUNTER — Encounter (HOSPITAL_COMMUNITY): Payer: Self-pay | Admitting: Emergency Medicine

## 2017-06-03 ENCOUNTER — Other Ambulatory Visit: Payer: Self-pay

## 2017-06-03 DIAGNOSIS — Y999 Unspecified external cause status: Secondary | ICD-10-CM | POA: Insufficient documentation

## 2017-06-03 DIAGNOSIS — F17210 Nicotine dependence, cigarettes, uncomplicated: Secondary | ICD-10-CM | POA: Insufficient documentation

## 2017-06-03 DIAGNOSIS — W25XXXA Contact with sharp glass, initial encounter: Secondary | ICD-10-CM | POA: Insufficient documentation

## 2017-06-03 DIAGNOSIS — Y939 Activity, unspecified: Secondary | ICD-10-CM | POA: Insufficient documentation

## 2017-06-03 DIAGNOSIS — Z23 Encounter for immunization: Secondary | ICD-10-CM | POA: Insufficient documentation

## 2017-06-03 DIAGNOSIS — S61411A Laceration without foreign body of right hand, initial encounter: Secondary | ICD-10-CM | POA: Insufficient documentation

## 2017-06-03 DIAGNOSIS — Y929 Unspecified place or not applicable: Secondary | ICD-10-CM | POA: Insufficient documentation

## 2017-06-03 NOTE — ED Triage Notes (Signed)
Patient was holding a shot glass and sat it down. The glass broke in her right hand. She has several lacerations inside her hand. Patient does not know if anymore glass is in it. Patient middle finger on the right side is also swollen.

## 2017-06-04 ENCOUNTER — Emergency Department (HOSPITAL_COMMUNITY): Payer: Self-pay

## 2017-06-04 ENCOUNTER — Emergency Department (HOSPITAL_COMMUNITY)
Admission: EM | Admit: 2017-06-04 | Discharge: 2017-06-04 | Disposition: A | Discharge status: Home / Self Care | Payer: Self-pay | Attending: Emergency Medicine | Admitting: Emergency Medicine

## 2017-06-04 DIAGNOSIS — S61411A Laceration without foreign body of right hand, initial encounter: Secondary | ICD-10-CM

## 2017-06-04 MED ORDER — TETANUS-DIPHTH-ACELL PERTUSSIS 5-2.5-18.5 LF-MCG/0.5 IM SUSP
0.5000 mL | Freq: Once | INTRAMUSCULAR | Status: AC
  Administered 2017-06-04: 0.5 mL via INTRAMUSCULAR
  Filled 2017-06-04: qty 0.5

## 2017-06-04 MED ORDER — DOXYCYCLINE HYCLATE 100 MG PO CAPS: 100.0000 mg | ORAL_CAPSULE | Freq: Two times a day (BID) | ORAL | 0 refills | Status: AC

## 2017-06-04 NOTE — ED Provider Notes (Signed)
[facility]-EMERGENCY DEPT Provider Note   CSN: [id number] Arrival date & time: 06/03/17  2255     History   Chief Complaint Chief Complaint  Patient presents with  . Extremity Laceration  . Finger Injury    middle finger    HPI [name] is a 32 y.o. female with a PMHx of PVCs, thrombocytopenia, and other conditions listed below, who presents to the ED with complaints of right hand laceration sustained approximately 24 hours ago (on the night of 06/02/17).  Patient states that she was holding a shot glass and was talking with her hands when the shot glass broke accidentally in her hand, causing a laceration to the palm and an abrasion to the palm and right ring finger.  She reports gradual onset gradually worsening right hand/palm pain which she describes as 8/10 constant sharp and throbbing R palm pain which radiates into her forearm, worse with hand movement, and with no treatments tried prior to arrival.  She reports associated swelling to the palm and middle finger, as well as numbness to the distal portion of her middle finger and difficulty with flexing her middle finger.  She reports that she believes that she removed all of the glass from the wound.  Her last tetanus was in 2012.  She denies any erythema or warmth to the hand or fingers.  She also denies any fevers, chills, CP, SOB, abd pain, N/V/D/C, hematuria, dysuria, tingling, or any other complaints at this time.  Denies possibility of pregnancy. Allergic to PCN and clindamycin.    The history is provided by the patient and medical records. No language interpreter was used.  Laceration   The incident occurred 12 to 24 hours ago. The laceration is located on the right hand. The laceration is 1 cm in size. The laceration mechanism was a broken glass. The pain is at a severity of 8/10. The pain is moderate. The pain has been constant since onset. She reports no foreign bodies present. Her tetanus status is  out of date.    Past Medical History:  Diagnosis Date  . Chlamydia   . Dysrhythmia   . Heart murmur    born with condition  . PVC's (premature ventricular contractions)   . Thrombocytopenia [facility])     Patient Active Problem List   Diagnosis Date Noted  . Thrombocytopenia (HCC) 10/16/2011  . Normal delivery 08/24/2011  . 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 Thrombocytopenia, unspecified (HCC) 07/08/2011  . 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 boil     facial boil i&d  . DILATION AND CURETTAGE OF UTERUS     abortion x3    OB History    Gravida Para Term Preterm AB Living   4 1 1   3 1    SAB TAB Ectopic Multiple Live Births           1       Home Medications    Prior to Admission medications   Medication Sig Start Date End Date Taking? Authorizing Provider  doxycycline (VIBRAMYCIN) 100 MG capsule Take 1 capsule (100 mg total) by mouth 2 (two) times daily. 12/24/16   Garlon Hatchet, PA-C  HYDROcodone-acetaminophen (NORCO/VICODIN) 5-325 MG tablet Take 2 tablets by mouth every 6 (six) hours as needed for severe pain. Patient not taking: Reported on 12/23/2016 10/23/16   Dietrich Pates, PA-C  ibuprofen (ADVIL,MOTRIN)  800 MG tablet Take 1 tablet (800 mg total) by mouth 3 (three) times daily. 12/24/16   Garlon HatchetSanders, Lisa M, PA-C    Family History Family History  Problem Relation Age of Onset  . Emphysema Mother   . Heart attack Mother   . Heart attack Father   . Heart disease Father   . Hypertension Father   . Heart attack Paternal Grandmother   . Thyroid disease Paternal Grandmother   . Cancer Maternal Grandmother   . Hypertension Paternal Grandfather   . Anesthesia problems Neg Hx     Social History Social History   Tobacco Use  . Smoking status: Current Every Day Smoker    Packs/day: 0.25    Years: 15.00    Pack years: 3.75     Types: Cigarettes  . Smokeless tobacco: Never Used  Substance Use Topics  . Alcohol use: Yes    Comment: occ  . Drug use: No    Comment: stopped using cocaine 1-2 yrs ago. stopped marijuana before pregnancy     Allergies   Peanut-containing drug products; Penicillins; and Clindamycin/lincomycin   Review of Systems Review of Systems  Constitutional: Negative for chills and fever.  Respiratory: Negative for shortness of breath.   Cardiovascular: Negative for chest pain.  Gastrointestinal: Negative for abdominal pain, constipation, diarrhea, nausea and vomiting.  Genitourinary: Negative for dysuria and hematuria.  Musculoskeletal: Positive for arthralgias, joint swelling and myalgias.  Skin: Positive for wound. Negative for color change.  Allergic/Immunologic: Negative for immunocompromised state.  Neurological: Negative for weakness and numbness.  Psychiatric/Behavioral: Negative for confusion.   All other systems reviewed and are negative for acute change except as noted in the HPI.    Physical Exam Updated Vital Signs BP 121/84 (BP Location: Right Arm)   Pulse (!) 119   Temp 97.9 F (36.6 C) (Oral)   Resp 20   Ht 5\' 4"  (1.626 m)   Wt 70.7 kg (155 lb 12.8 oz)   LMP 05/20/2017   SpO2 100%   BMI 26.74 kg/m   Physical Exam  Constitutional: She is oriented to person, place, and time. Vital signs are normal. She appears well-developed and well-nourished.  Non-toxic appearance. No distress.  Afebrile, nontoxic, NAD although appears anxious  HENT:  Head: Normocephalic and atraumatic.  Mouth/Throat: Mucous membranes are normal.  Eyes: Conjunctivae and EOM are normal. Right eye exhibits no discharge. Left eye exhibits no discharge.  Neck: Normal range of motion. Neck supple.  Cardiovascular: Intact distal pulses. Tachycardia present.  Slightly tachycardic likely from anxiousness  Pulmonary/Chest: Effort normal. No respiratory distress.  Abdominal: Normal appearance. She  exhibits no distension.  Musculoskeletal:       Right hand: She exhibits decreased range of motion (of middle finger, just with flexion), tenderness, laceration and swelling. She exhibits no bony tenderness, normal capillary refill and no deformity. Normal sensation noted. Normal strength noted.  R hand with small ~1.5cm linear lac to palmar aspect in the middle of the palm, dried blood around the wound, no retained FBs noted, no ongoing bleeding, mild swelling around the wound and into middle finger, but no erythema or warmth, no bruising, no crepitus or deformity, wound extends into subQ fat but doesn't appear to disrupt musculature or tendons, no obvious nerve damage noted within wound, with mild TTP to wound but no focal bony TTP. Small abrasions to palm and ring finger. Grip strength intact, and sensation grossly intact throughout all digits including middle finger although pt reports subjectively altered sensation  in distal portion of finger. Distal pulses intact and cap refill brisk and present in all digits, compartments soft. FROM intact in all digits except middle finger, which has slightly diminished flexion in PIP/DIP joint of middle finger but still able to flex ~50% and fully extend. SEE PICTURE BELOW  Neurological: She is alert and oriented to person, place, and time. She has normal strength. No sensory deficit.  Skin: Skin is warm and dry. Laceration noted. No rash noted.  R palm lac as mentioned above and pictured below  Psychiatric: She has a normal mood and affect. Her behavior is normal.  Nursing note and vitals reviewed.      ED Treatments / Results  Labs (all labs ordered are listed, but only abnormal results are displayed) Labs Reviewed - No data to display  EKG  EKG Interpretation None       Radiology Dg Hand Complete Right  Result Date: 06/04/2017 CLINICAL DATA:  Patient cut her hand with lacerations along the palmar surface of the second through fourth  metacarpals. EXAM: RIGHT HAND - COMPLETE 3+ VIEW COMPARISON:  Wrist radiographs from 03/23/2007 FINDINGS: The fingers are held in flexion slightly limiting assessment. There is no acute fracture. Suggestion of chronic healed fifth metacarpal fracture. No suspicious osseous lesions. No bone destruction. No radiopaque foreign body. No soft tissue emphysema. IMPRESSION: No acute osseous abnormality of the right hand and wrist. No radiopaque foreign body is noted. Electronically Signed   By: Tollie Eth M.D.   On: 06/04/2017 00:52    Procedures Procedures (including critical care time)  Medications Ordered in ED Medications  Tdap (BOOSTRIX) injection 0.5 mL (0.5 mLs Intramuscular Given 06/04/17 0208)     Initial Impression / Assessment and Plan / ED Course  I have reviewed the triage vital signs and the nursing notes.  Pertinent labs & imaging results that were available during my care of the patient were reviewed by me and considered in my medical decision making (see chart for details).     32 y.o. female here with R hand lac sustained 24hrs ago and associated R middle finger swelling and paresthesias and difficulty flexing digit. On exam, ~1.5cm linear lac to palm of R hand, dried blood crusted around it with no ongoing bleeding, no retained FBs visualized, extends to subQ fat but doesn't appear to disrupt tendons or muscle/fascia. No erythema or warmth of the hand/fingers. Small abrasions to palm and ring finger. Sensation grossly intact, grip strength fairly well preserved, cap refill brisk and present, diffuse tenderness around the wound but otherwise no focal bony TTP. Xray obtained during triage negative for retained FBs or osseous injury. Given length of time that has passed, will allow for wound to heal via secondary intent. Will update Tdap, discussed proper wound care. Will start on ppx abx to prevent infection. Discussed RICE, tylenol/motrin for pain, and f/up with [facility] or ED in 2-3 days  for wound check. I explained the diagnosis and have given explicit precautions to return to the ER including for any other new or worsening symptoms. The patient understands and accepts the medical plan as it's been dictated and I have answered their questions. Discharge instructions concerning home care and prescriptions have been given. The patient is STABLE and is discharged to home in good condition.    Final Clinical Impressions(s) / ED Diagnoses   Final diagnoses:  Laceration of right hand without foreign body, initial encounter    ED Discharge Orders  Ordered    doxycycline (VIBRAMYCIN) 100 MG capsule  2 times daily     06/04/17 [address], [place], [place] 06/04/17 0215    Tegeler, Canary Brim, MD 06/04/17 [phone]

## 2017-06-04 NOTE — Discharge Instructions (Addendum)
Keep wound clean with mild soap and water. Keep area covered with a topical antibiotic ointment and bandage, keep bandage dry, and do not submerge in water until it has healed over. Ice and elevate for additional pain and swelling relief. Alternate between Ibuprofen and Tylenol for additional pain relief. Take antibiotic as directed until completed to help prevent infection. Follow up with your primary care doctor or [facility] in approximately 2-3 days for wound recheck. Monitor area for signs of infection to include, but not limited to: increasing pain, spreading redness, drainage/pus, worsening swelling, or fevers. Return to emergency department for emergent changing or worsening symptoms.

## 2017-08-18 ENCOUNTER — Emergency Department (HOSPITAL_COMMUNITY)
Admission: EM | Admit: 2017-08-18 | Discharge: 2017-08-18 | Disposition: A | Discharge status: Home / Self Care | Payer: Self-pay | Attending: Emergency Medicine | Admitting: Emergency Medicine

## 2017-08-18 ENCOUNTER — Encounter (HOSPITAL_COMMUNITY): Payer: Self-pay | Admitting: Emergency Medicine

## 2017-08-18 DIAGNOSIS — R51 Headache: Secondary | ICD-10-CM | POA: Insufficient documentation

## 2017-08-18 DIAGNOSIS — R1110 Vomiting, unspecified: Secondary | ICD-10-CM | POA: Insufficient documentation

## 2017-08-18 DIAGNOSIS — Z5321 Procedure and treatment not carried out due to patient leaving prior to being seen by health care provider: Secondary | ICD-10-CM | POA: Insufficient documentation

## 2017-08-18 NOTE — ED Triage Notes (Signed)
Pt c/o migraine for 5 days with n/v and sensitive to lights and sound. Taken her migraine medication with no relief.

## 2017-08-18 NOTE — ED Notes (Signed)
Pt called for VS with no answer.  ?

## 2017-09-16 ENCOUNTER — Emergency Department (HOSPITAL_COMMUNITY)
Admission: EM | Admit: 2017-09-16 | Discharge: 2017-09-16 | Discharge status: Against Medical Advice | Payer: Self-pay | Attending: Emergency Medicine | Admitting: Emergency Medicine

## 2017-09-16 ENCOUNTER — Encounter (HOSPITAL_COMMUNITY): Payer: Self-pay

## 2017-09-16 ENCOUNTER — Other Ambulatory Visit: Payer: Self-pay

## 2017-09-16 DIAGNOSIS — Z5321 Procedure and treatment not carried out due to patient leaving prior to being seen by health care provider: Secondary | ICD-10-CM | POA: Insufficient documentation

## 2017-09-16 DIAGNOSIS — R600 Localized edema: Secondary | ICD-10-CM | POA: Insufficient documentation

## 2017-09-16 NOTE — ED Triage Notes (Signed)
Patient reports that she was seen in the ED on 05/1517 when she had a shot glass break in her right hand, causing a laceration. Patient states she now has swelling and is uanble to bend her right middle finger.

## 2017-09-16 NOTE — ED Notes (Signed)
Pt called to be roomed x2 with no response.  RN notified. 

## 2017-09-16 NOTE — ED Notes (Signed)
Patient called x2 no answer.
